# Patient Record
Sex: Male | Born: 1940 | Race: White | Hispanic: No | Marital: Married | Smoking: Former smoker
Health system: Southern US, Community
[De-identification: ages and names within clinical notes are randomized; demographics above are authoritative.]

## PROBLEM LIST (undated history)

## (undated) DIAGNOSIS — M1990 Unspecified osteoarthritis, unspecified site: Secondary | ICD-10-CM

## (undated) DIAGNOSIS — H269 Unspecified cataract: Secondary | ICD-10-CM

## (undated) DIAGNOSIS — I2510 Atherosclerotic heart disease of native coronary artery without angina pectoris: Secondary | ICD-10-CM

## (undated) DIAGNOSIS — I10 Essential (primary) hypertension: Secondary | ICD-10-CM

## (undated) DIAGNOSIS — I639 Cerebral infarction, unspecified: Secondary | ICD-10-CM

## (undated) DIAGNOSIS — I517 Cardiomegaly: Secondary | ICD-10-CM

## (undated) DIAGNOSIS — E785 Hyperlipidemia, unspecified: Secondary | ICD-10-CM

## (undated) DIAGNOSIS — R251 Tremor, unspecified: Secondary | ICD-10-CM

## (undated) DIAGNOSIS — I350 Nonrheumatic aortic (valve) stenosis: Secondary | ICD-10-CM

## (undated) DIAGNOSIS — K219 Gastro-esophageal reflux disease without esophagitis: Secondary | ICD-10-CM

## (undated) HISTORY — PX: NASAL SINUS SURGERY: SHX719

## (undated) HISTORY — DX: Essential (primary) hypertension: I10

## (undated) HISTORY — DX: Unspecified osteoarthritis, unspecified site: M19.90

## (undated) HISTORY — DX: Nonrheumatic aortic (valve) stenosis: I35.0

## (undated) HISTORY — DX: Atherosclerotic heart disease of native coronary artery without angina pectoris: I25.10

## (undated) HISTORY — DX: Cardiomegaly: I51.7

## (undated) HISTORY — DX: Hyperlipidemia, unspecified: E78.5

## (undated) HISTORY — PX: FRACTURE SURGERY: SHX138

## (undated) HISTORY — PX: COLONOSCOPY: SHX174

---

## 2003-07-14 HISTORY — PX: CORONARY ARTERY BYPASS GRAFT: SHX141

## 2004-05-31 ENCOUNTER — Inpatient Hospital Stay (HOSPITAL_COMMUNITY): Admission: AD | Admit: 2004-05-31 | Discharge: 2004-06-10 | Payer: Self-pay | Admitting: Cardiothoracic Surgery

## 2014-12-07 HISTORY — PX: TEE WITHOUT CARDIOVERSION: SHX5443

## 2015-01-17 HISTORY — PX: CARDIAC CATHETERIZATION: SHX172

## 2015-01-24 ENCOUNTER — Other Ambulatory Visit: Payer: Self-pay | Admitting: *Deleted

## 2015-01-24 DIAGNOSIS — I350 Nonrheumatic aortic (valve) stenosis: Secondary | ICD-10-CM

## 2015-01-29 ENCOUNTER — Encounter: Payer: Self-pay | Admitting: *Deleted

## 2015-01-29 ENCOUNTER — Ambulatory Visit (INDEPENDENT_AMBULATORY_CARE_PROVIDER_SITE_OTHER): Payer: Medicare Other | Admitting: Cardiovascular Disease

## 2015-01-29 ENCOUNTER — Institutional Professional Consult (permissible substitution) (INDEPENDENT_AMBULATORY_CARE_PROVIDER_SITE_OTHER): Payer: Medicare Other | Admitting: Cardiothoracic Surgery

## 2015-01-29 ENCOUNTER — Encounter: Payer: Self-pay | Admitting: Cardiothoracic Surgery

## 2015-01-29 ENCOUNTER — Encounter: Payer: Self-pay | Admitting: Cardiovascular Disease

## 2015-01-29 VITALS — BP 130/68 | HR 67 | Ht 69.0 in | Wt 146.0 lb

## 2015-01-29 VITALS — BP 127/71 | HR 69 | Resp 16 | Ht 69.0 in | Wt 145.0 lb

## 2015-01-29 DIAGNOSIS — I350 Nonrheumatic aortic (valve) stenosis: Secondary | ICD-10-CM

## 2015-01-29 DIAGNOSIS — I2510 Atherosclerotic heart disease of native coronary artery without angina pectoris: Secondary | ICD-10-CM | POA: Insufficient documentation

## 2015-01-29 DIAGNOSIS — I10 Essential (primary) hypertension: Secondary | ICD-10-CM | POA: Insufficient documentation

## 2015-01-29 DIAGNOSIS — M1990 Unspecified osteoarthritis, unspecified site: Secondary | ICD-10-CM | POA: Insufficient documentation

## 2015-01-29 DIAGNOSIS — I517 Cardiomegaly: Secondary | ICD-10-CM | POA: Insufficient documentation

## 2015-01-29 DIAGNOSIS — E785 Hyperlipidemia, unspecified: Secondary | ICD-10-CM | POA: Insufficient documentation

## 2015-01-29 NOTE — Progress Notes (Signed)
[address]       [place] [address]             [phone]                    Brandon Scott [facility] Medical Record #[id number] Date of Birth: 12/17/1940  Referring: Brandon Courts, RN Primary Care: Pcp Not In System  Chief Complaint:    Chief Complaint  Patient presents with  . Aortic Stenosis    SEVERE..EVAL FOR AVR VS TAVR.Marland KitchenECHO 12/07/14, CATH 01/17/15 ...both performed @ [facility] [place]    History of Present Illness:    Brandon Scott 74 y.o. male is seen in the office  today for aortic stenosis. He noted increasing shortness of breath and weakness on exertion after doing work around his house. He has been mowing the grass with a self-propelled mower but notes over the past 3 months that this is become increasingly difficult. He denies any definite syncopal episodes but does admit to presyncope especially when exerting himself. Recent evaluation in [facility] demonstrated severe aortic stenosis.    The patient has a history of CAD and previous CABG in 2005 done by me. At that time he had coronary artery bypass grafting 4 with the left internal mammary to left anterior descending coronary artery reverse saphenous vein graft to the first and second obtuse marginal and reverse saphenous 9 graft to the distal right coronary artery.  He underwent echo and cardiac studies in [place]. An echo from 12/07/2014 showed normal LV function with an LVEF 55-60%, with mild LVH and aortic stenosis with peak and mean gradients of 61 and 30 mmHg, respectively. The calculated AVA is 0.6 square cm. Cardiac catheterization 01/18/2015 demonstrating severe native vessel CAD with total occlusion of the mid-LAD, left circumflex origin, and proximal RCA. The patient's bypass grafts were patent and these include a SVG-right PDA, SVG sequential to OM1 and OM2, and LIMA-LAD. His LVEF was normal and estimated at 60% with normal LVEDP and  normal right heart pressures. Invasive hemodynamics demonstrate preserved CO 3.36 L/min, peak and mean transaortic valve gradients of 26 and 25 mmHg ( ? Recording of data), respectively, and a calculated aortic valve area of 0.81 square cm.  Patient also describes poor appetite and significant weight loss, at the time of his coronary bypass surgery 11 years ago he weighed 192 pounds he is now down to 145, he's had a 5-10 pound weight loss over the last 3-4 months.   Current Activity/ Functional Status:  Patient is independent with mobility/ambulation, transfers, ADL's, IADL's.   Zubrod Score: At the time of surgery this patient's most appropriate activity status/level should be described as: []     0    Normal activity, no symptoms [x]     1    Restricted in physical strenuous activity but ambulatory, able to do out light work []     2    Ambulatory and capable of self care, unable to do work activities, up and about               >50 % of waking hours                              []     3    Only limited self care, in bed greater than 50% of waking hours []   4    Completely disabled, no self care, confined to bed or chair []     5    Moribund   Past Medical History  Diagnosis Date  . CAD (coronary artery disease)   . Aortic stenosis   . Hyperlipidemia   . Hypertension   . Left ventricular hypertrophy   . Arthritis     Past Surgical History  Procedure Laterality Date  . Coronary artery bypass graft  2005  . Tee without cardioversion  12/07/14    [facility] [place]  . Cardiac catheterization  01/17/15    [facility] [place]  . Fracture surgery Left   . Nasal sinus surgery      No family history on file.  History   Social History  . Marital Status: Married    Spouse Name: N/A  . Number of Children: N/A  . Years of Education: N/A   Occupational History  . Not on file.   Social History Main Topics  . Smoking status: Former Smoker --  1.00 packs/day for 15 years    Types: Cigarettes    Quit date: 01/29/1975  . Smokeless tobacco: Never Used  . Alcohol Use: No  . Drug Use: Not on file  . Sexual Activity: Not on file   Other Topics Concern  . Not on file   Social History Narrative    History  Smoking status  . Former Smoker -- 1.00 packs/day for 15 years  . Types: Cigarettes  . Quit date: 01/29/1975  Smokeless tobacco  . Never Used    History  Alcohol Use No     Allergies  Allergen Reactions  . Sulfa Antibiotics Hives    Current Outpatient Prescriptions  Medication Sig Dispense Refill  . aspirin 81 MG tablet Take 81 mg by mouth daily.    . fluticasone (FLONASE) 50 MCG/ACT nasal spray Place 1 spray into both nostrils 2 (two) times daily.    . metoprolol tartrate (LOPRESSOR) 25 MG tablet Take 25 mg by mouth 2 (two) times daily.    . niacin 250 MG tablet Take 250 mg by mouth at bedtime.    . primidone (MYSOLINE) 50 MG tablet Take by mouth 2 (two) times daily.     No current facility-administered medications for this visit.      Review of Systems:     Cardiac Review of Systems: Y or N  Chest Pain [  n  ]  Resting SOB [ n  ] Exertional SOB  Cove.Etienne[y  ]  Orthopnea [ y ]   Pedal Edema [n   ]    Palpitations [ n ] Syncope  Milo.Brash[n  ]   Presyncope Cove.Etienne[y   ]  General Review of Systems: [Y] = yes [  ]=no Constitional: recent weight change Cove.Etienne[y  ];  Wt loss over the last 3 months [ 5  ] anorexia [  ]; fatigue [  ]; nausea [  ]; night sweats [  ]; fever [  ]; or chills [  ];          Dental: poor dentition[  ]; Last Dentist visit:   Eye : blurred vision [  ]; diplopia [   ]; vision changes [  ];  Amaurosis fugax[  ]; Resp: cough [n  ];  wheezing[  n];  hemoptysis[n  ]; shortness of breath[y  ]; paroxysmal nocturnal dyspnea[  y]; dyspnea on exertion[ y ]; or orthopnea[  ];  GI:  gallstones[  ], vomiting[  ];  dysphagia[  ]; melena[  ];  hematochezia [  ]; heartburn[  ];   Hx of  Colonoscopy[  ]; GU: kidney stones [  ];  hematuria[  ];   dysuria [  ];  nocturia[  ];  history of     obstruction [  ]; urinary frequency [  ]             Skin: rash, swelling[  ];, hair loss[  ];  peripheral edema[  ];  or itching[  ]; Musculosketetal: myalgias[  ];  joint swelling[  ];  joint erythema[  ];  joint pain[  ];  back pain[  ];  Heme/Lymph: bruising[  ];  bleeding[  ];  anemia[  ];  Neuro: TIA[  ];  headaches[  ];  stroke[  ];  vertigo[  n];  seizures[n  ];   paresthesias[n  ];  difficulty walking[  y];  Psych:depression[ n ]; anxiety[ y ];  Endocrine: diabetes[ n ];  thyroid dysfunction[  ];  Immunizations: Flu up to date [  y]; Pneumococcal up to date [ y ];  Other:  Physical Exam: BP 127/71 mmHg  Pulse 69  Resp 16  Ht  (1.753 m)  Wt 145 lb (65.772 kg)  BMI 21.40 kg/m2  SpO2 98%  PHYSICAL EXAMINATION: General appearance: alert, cooperative, appears older than stated age, distracted and slowed mentation Head: Normocephalic, without obvious abnormality, atraumatic Neck: no adenopathy, no carotid bruit, no JVD, supple, symmetrical, trachea midline and thyroid not enlarged, symmetric, no tenderness/mass/nodules Lymph nodes: Cervical, supraclavicular, and axillary nodes normal. Resp: clear to auscultation bilaterally Back: symmetric, no curvature. ROM normal. No CVA tenderness. Cardio: systolic murmur: holosystolic 3/6, crescendo throughout the precordium GI: soft, non-tender; bowel sounds normal; no masses,  no organomegaly Extremities: extremities normal, atraumatic, no cyanosis or edema and Homans sign is negative, no sign of DVT Neurologic: Grossly normal  Slow moving gate, no tremor ecce pt of lower lip, none in hands  Diagnostic Studies & Laboratory data:     Recent Radiology Findings:   No results found.   I have independently reviewed the above radiologic studies.  Recent Lab Findings: No results found for: WBC, HGB, HCT, PLT, GLUCOSE, CHOL, TRIG, HDL, LDLDIRECT, LDLCALC, ALT, AST, NA, K, CL,  CREATININE, BUN, CO2, TSH, INR, GLUF, HGBA1C  ECHO and Cath films loaded into PACS system and reviewed   Assessment / Plan:   Patient has severe underlying coronary artery disease of his native vessels with patent grafts. He now has critical aortic stenosis which is symptomatic. He would require redo surgery using standard aortic valve replacement. Because of his frail appearance on exam I would think that standard redo surgery would be high risk for him. I would recommend proceeding with evaluation for T AVER including scans to evaluate aortic root and access. I discussed this with the patient and his family who concur. His family notes that over the past 6 months he's had significant overall decline in his physical ability.      I  spent 55 minutes counseling the patient face to face and 50% or more the  time was spent in counseling and coordination of care. The total time spent in the appointment was 40 minutes.  Delight Ovens MD      [address] [place] [place] [address] Office [phone]   Beeper [phone]  01/29/2015 4:10 PM

## 2015-01-29 NOTE — Patient Instructions (Signed)
Medication Instructions:  Your physician recommends that you continue on your current medications as directed. Please refer to the Current Medication list given to you today.  Labwork: No new orders.   Testing/Procedures: No new orders.   Follow-Up: Please continue with further TAVR evaluation.   Any Other Special Instructions Will Be Listed Below (If Applicable).

## 2015-01-29 NOTE — Progress Notes (Addendum)
Cardiology Office Note Date:  01/29/2015   ID:  Brandon Scott, DOB 15-Nov-1940, MRN [id number]  PCP:  Pcp Not In System  Cardiologist:  Tonny Bollmanooper, Arlenne Kimbley, MD    Chief Complaint  Patient presents with  . Shortness of Breath   History of Present Illness: Brandon Scott is a 74 y.o. male who presents for evaluation of severe aortic stenosis.   The patient has a history of CAD and previous CABG in 2005 by Dr Tyrone SageGerhardt. He recently underwent echo and cardiac studies. An echo from 12/07/2014 showed normal LV function with an LVEF 55-60%, with mild LVH and aortic stenosis with peak and mean gradients of 61 and 30 mmHg, respectively. The calculated AVA is 0.6 square cm. Cardiac catheterization 01/18/2015 demonstrating severe native vessel CAD with total occlusion of the mid-LAD, left circumflex origin, and proximal RCA. The patient's bypass grafts were patent and these include a SVG-right PDA, SVG sequential to OM1 and OM2, and LIMA-LAD. His LVEF was normal and estimated at 60% with normal LVEDP and normal right heart pressures. Invasive hemodynamics demonstrate preserved CO 3.36 L/min, peak and mean transaortic valve gradients of 26 and 25 mmHg, respectively, and a calculated aortic valve area of 0.81 square cm.  Over the past 2-3 years, he has developed progressive fatigue and exercise intolerance. He complains of shortness of breath with activity. Reports occasional orthopnea, but no PND or leg swelling. No chest pain, chest pressure, lightheadedness, or syncope.   He has developed an essential tremor and this has progressed over recent months. Has some problems with secretions/salivation as well. He has been evaluated by neurology and has treatment recently with mysoline. He has not been diagnosed with Parkinson's. Apparently his mother had a similar tremor as she aged.   The patient has lost significant weight over recent years. Since CABG he has lost 70 pounds slowly. Appetite is reported as ok, and  he eats small portions.     Past Medical History  Diagnosis Date  . CAD (coronary artery disease)   . Aortic stenosis   . Hyperlipidemia   . Hypertension   . Left ventricular hypertrophy   . Arthritis     Past Surgical History  Procedure Laterality Date  . Coronary artery bypass graft  2005  . Tee without cardioversion  12/07/14    [facility] [place]  . Cardiac catheterization  01/17/15    [facility] [place]  . Fracture surgery Left   . Nasal sinus surgery      Current Outpatient Prescriptions  Medication Sig Dispense Refill  . aspirin 81 MG tablet Take 81 mg by mouth daily.    . fluticasone (FLONASE) 50 MCG/ACT nasal spray Place 1 spray into both nostrils 2 (two) times daily.    . metoprolol (LOPRESSOR) 50 MG tablet Take 25 mg by mouth 2 (two) times daily.   0  . niacin 250 MG tablet Take 250 mg by mouth at bedtime.    Marland Kitchen. omeprazole (PRILOSEC) 40 MG capsule TK 1 C PO D  0  . primidone (MYSOLINE) 50 MG tablet Take by mouth 2 (two) times daily.     No current facility-administered medications for this visit.    Allergies:   Sulfa antibiotics   Social History:  The patient  reports that he quit smoking about 40 years ago. His smoking use included Cigarettes. He has a 15 pack-year smoking history. He has never used smokeless tobacco. He reports that he does  not drink alcohol.   Family History:  The patient's  family history includes Heart attack in his father and mother. There is no history of Hypertension or Stroke.    ROS:  Please see the history of present illness.  Otherwise, review of systems is positive for unexplained weight loss, decreased appetite, hearing loss, sinus congestion, tremor, leg pain.  All other systems are reviewed and negative.    PHYSICAL EXAM: VS:  BP 130/68 mmHg  Pulse 67  Ht  (1.753 m)  Wt 146 lb (66.225 kg)  BMI 21.55 kg/m2 , BMI Body mass index is 21.55 kg/(m^2). GEN: Well nourished, well  developed, somewhat delayed/deliberate speech, in no acute distress HEENT: normal Neck: no JVD, no masses. Delayed upstrokes with bilateral bruits CV: RRR with late peaking 3/6 systolic murmur at RUSB, A2 is present                Respiratory:  Coarse breath sounds bilaterally, normal work of breathing GI: soft, nontender, nondistended, + BS MS: no deformity or atrophy Ext: no pretibial edema, pedal pulses 2+= bilaterally Skin: warm and dry, no rash Neuro:  Strength and sensation are intact Psych: euthymic mood, full affect  EKG:  EKG is ordered today. The ekg ordered today shows NSR 67 bpm, age-indeterminate septal infarct, nonspecific ST/T abnormality  Recent Labs: No results found for requested labs within last 365 days.   Lipid Panel  No results found for: CHOL, TRIG, HDL, CHOLHDL, VLDL, LDLCALC, LDLDIRECT    Wt Readings from Last 3 Encounters:  01/29/15 146 lb (66.225 kg)  01/29/15 145 lb (65.772 kg)     Cardiac Studies Reviewed: Echo and cath films/reports reviewed  STS Risk Calculator: STS PROM = 2.5%  ASSESSMENT AND PLAN: 74 yo male with Stage D, severe aortic stenosis. He has a hx of severe CAD and previous CABG with patent bypass grafts by recent cath. He has NYHA III symptoms of dyspnea and fatigue. I have personally reviewed his echo and cath images and agree that he has severe aortic stenosis with severe calcification and restriction of his aortic leaflets, a calculated AVA between 0.6 and 0.8 square cm, and a dimensionless index of 0.17. I have reviewed the natural history of aortic stenosis with the patient and his family members who are present today. We have discussed the limitations of medical therapy and the poor prognosis associated with symptomatic aortic stenosis. We have also reviewed potential treatment options, including palliative medical therapy, conventional surgical aortic valve replacement, and transcatheter aortic valve replacement. We discussed  treatment options in the context of this patient's specific comorbid medical conditions. The patient has been seen by Dr Tyrone Sage and well as a cardiac surgeon at his home of [place], [place], both of whom recommended TAVR for treatment of severe aortic stenosis. I agree that TAVR would likely be favorable to conventional surgery considering his patent bypass grafts, progressive functional decline over recent years, and substantial weight loss and associated physical frailty.   I have reviewed plans for CTA studies of the heart, chest/abd/pelvis and rationale for both as they pertain to TAVR surgical planning. The patient will see Dr Laneta Simmers tomorrow after these studies are completed.   TAVR surgery was reviewed in detail with the patient, his wife, and his son who is an interventional radiologist.  The patient has been advised of a variety of complications that might develop including but not limited to risks of death, stroke, paravalvular leak, aortic dissection or other major vascular complications,  aortic annulus rupture, device embolization, cardiac rupture or perforation, mitral regurgitation, acute myocardial infarction, arrhythmia, heart block or bradycardia requiring permanent pacemaker placement, congestive heart failure, respiratory failure, renal failure, pneumonia, infection, other late complications related to structural valve deterioration or migration, or other complications that might ultimately cause a temporary or permanent loss of functional independence or other long term morbidity.  The patient agrees to proceed with TAVR pending the results of upcoming testing to confirm his suitability for the procedure.   Current medicines are reviewed with the patient today.  The patient does not have concerns regarding medicines.  Labs/ tests ordered today include:  No orders of the defined types were placed in this encounter.    Enzo Bi, MD  01/29/2015 4:30 PM    [facility] [address] [place], [place], [place]  [address] Phone: [phone]; Fax: [phone]

## 2015-01-30 ENCOUNTER — Ambulatory Visit (HOSPITAL_COMMUNITY): Payer: Medicare Other

## 2015-01-30 ENCOUNTER — Ambulatory Visit: Payer: Medicare Other | Attending: Surgery | Admitting: Physical Therapy

## 2015-01-30 ENCOUNTER — Ambulatory Visit (HOSPITAL_COMMUNITY)
Admission: RE | Admit: 2015-01-30 | Discharge: 2015-01-30 | Disposition: A | Discharge status: Home / Self Care | Payer: Medicare Other | Source: Ambulatory Visit | Attending: Cardiovascular Disease | Admitting: Cardiovascular Disease

## 2015-01-30 ENCOUNTER — Institutional Professional Consult (permissible substitution) (INDEPENDENT_AMBULATORY_CARE_PROVIDER_SITE_OTHER): Payer: Medicare Other | Admitting: Surgery

## 2015-01-30 ENCOUNTER — Encounter: Payer: Self-pay | Admitting: Physical Therapy

## 2015-01-30 ENCOUNTER — Other Ambulatory Visit: Payer: Self-pay | Admitting: *Deleted

## 2015-01-30 VITALS — BP 100/66 | HR 92 | Resp 16 | Ht 69.0 in | Wt 145.0 lb

## 2015-01-30 DIAGNOSIS — I350 Nonrheumatic aortic (valve) stenosis: Secondary | ICD-10-CM | POA: Insufficient documentation

## 2015-01-30 DIAGNOSIS — Z01818 Encounter for other preprocedural examination: Secondary | ICD-10-CM | POA: Diagnosis not present

## 2015-01-30 DIAGNOSIS — Z951 Presence of aortocoronary bypass graft: Secondary | ICD-10-CM | POA: Diagnosis not present

## 2015-01-30 DIAGNOSIS — R262 Difficulty in walking, not elsewhere classified: Secondary | ICD-10-CM

## 2015-01-30 MED ORDER — IOHEXOL 350 MG/ML SOLN
75.0000 mL | Freq: Once | INTRAVENOUS | Status: AC | PRN
  Administered 2015-01-30: 100 mL via INTRAVENOUS

## 2015-01-30 MED ORDER — IOHEXOL 350 MG/ML SOLN
80.0000 mL | Freq: Once | INTRAVENOUS | Status: AC | PRN
  Administered 2015-01-30: 50 mL via INTRAVENOUS

## 2015-01-30 NOTE — Therapy (Signed)
[facility] [address] [place], [place], [address] Phone: [phone]   Fax:  [phone]  Physical Therapy Evaluation  Patient Details  Name: Brandon Scott MRN: [id number] Date of Birth: 12-Sep-1940 Referring Provider:  Alleen Borne, MD  Encounter Date: 01/30/2015      PT End of Session - 01/30/15 0927    Visit Number 1   PT Start Time 0840   PT Stop Time 0920   PT Time Calculation (min) 40 min      Past Medical History  Diagnosis Date  . CAD (coronary artery disease)   . Aortic stenosis   . Hyperlipidemia   . Hypertension   . Left ventricular hypertrophy   . Arthritis     Past Surgical History  Procedure Laterality Date  . Coronary artery bypass graft  2005  . Tee without cardioversion  12/07/14    [facility] [place]  . Cardiac catheterization  01/17/15    [facility] [place]  . Fracture surgery Left   . Nasal sinus surgery      There were no vitals filed for this visit.  Visit Diagnosis:  Difficulty walking - Plan: PT plan of care cert/re-cert  Severe aortic stenosis - Plan: PT plan of care cert/re-cert      Subjective Assessment - 01/30/15 0843    Subjective SOB, fatigue, low energy began about 6 months ago and gradually worsening; limiting golf, yardwork   Patient Stated Goals return to golf, yardwork, managing tomato garden   Currently in Pain? No/denies            [facility] PT Assessment - 01/30/15 0001    Assessment   Medical Diagnosis severe aortic stenosis   Onset Date/Surgical Date 08/01/14  approximate   Precautions   Precaution Comments no strenusous activity   Restrictions   Weight Bearing Restrictions No   Balance Screen   Has the patient fallen in the past 6 months No   Has the patient had a decrease in activity level because of a fear of falling?  No   Is the patient reluctant to leave their home because of a fear of falling?  No   Home  [employer] information [employer] manager residence   Living Arrangements Spouse/significant other   Home Access Stairs to enter   Entrance Stairs-Number of Steps 3   Entrance Stairs-Rails Right;Left;Can reach both   Home Layout One level   Prior Function   Level of Independence Independent  yardwork, golf, gardening   Vocation Retired  Visual merchandiser   Posture/Postural Control   Posture/Postural Control Postural limitations   Postural Limitations Forward head;Rounded Shoulders   ROM / Strength   AROM / PROM / Strength AROM;Strength   AROM   Overall AROM  Within functional limits for tasks performed   Strength   Overall Strength Comments grossly 4-5/5 throughout   Strength Assessment Site Hand   Right/Left hand Right;Left   Right Hand Grip (lbs) 70  R hand dominant   Left Hand Grip (lbs) 69   Ambulation/Gait   Gait Comments Pt demonstrates good step/stride length and no significant gait deviations.          [facility] Pre-Surgical Assessment - 01/30/15 0001    5 Meter Walk Test- trial 1 5 sec   5 Meter Walk Test- trial 2 4 sec.    5 Meter Walk Test- trial 3 4 sec.  </= 6 sec WNL  5 meter walk test average 4.33 sec   Timed Up & Go Test trial  10 sec.   Comments </= 12 sec not indicative of high fall risk   4 Stage Balance Test tolerated for:  10 sec.   4 Stage Balance Test Position 4   comment not indicative of high fall risk   Sit To Stand Test- trial 1 16 sec.   Comment </= 12.6 WNL   ADL/IADL Independent with: Bathing;Dressing;Meal prep;Finances   ADL/IADL Needs Assistance with: Pincus Badder work  requires rest breaks and assist with part of it   6 Minute Walk- Baseline yes   BP (mmHg) 130/78 mmHg   HR (bpm) 89   02 Sat (%RA) 96 %   Modified Borg Scale for Dyspnea 0- Nothing at all   Perceived Rate of Exertion (Borg) 6-   6 Minute Walk Post Test yes   BP (mmHg) 138/78 mmHg   HR (bpm) 147   02 Sat (%RA) 97 %   Modified Borg Scale for Dyspnea 1- Very mild  shortness of breath   Perceived Rate of Exertion (Borg) 15- Hard   Aerobic Endurance Distance Walked 815   Endurance additional comments PT asked patient to rest at 3:56 as HR was at maximal range and pt reported if he were at home and felt this tired, he would rest. It ook 3-4 minutes for HR to recover to baseline and pt did not feel recovered enough to resume within the 6 minute timeframe.                          PT Education - 2015-02-06 [phone]    Education provided Yes   Education Details importance of frequent rests with extended activity as HR reaches max range   Person(s) Educated Patient;Spouse;Child(ren)   Methods Explanation   Comprehension Verbalized understanding                    Plan - 06-Feb-2015 0928    Clinical Impression Statement Pt is a 74 yo male presenting to OP PT for evaluation prior to possible TAVR surgery due to severe aortic stenosis. Pt reports onset of SOB, slowed energy/fatigue that began approximately 6 months ago and has gradually worsened. It is now limiting him with his gardening, yardwork, and golf. Pt presents with good ROM/strength, balance, and fair posture. Pt's gait speed and quality are good however he is significantly limited with his endurance due to HR increasing to max range quickly with sustained ambulation. Pt ambulated 815' in 6 minute walk test and had to stop at just under 4 minutes due to HR of 147. It took about 3-4 minutes of seated rest to recover.    PT Frequency One time visit          G-Codes - February 06, 2015 0932    Functional Assessment Tool Used 6 minute walk 815' limited at 4 minutes due to max HR   Functional Limitation Mobility: Walking and moving around   Mobility: Walking and Moving Around Current Status [phone]) At least 20 percent but less than 40 percent impaired, limited or restricted   Mobility: Walking and Moving Around Goal Status [phone]) At least 20 percent but less than 40 percent impaired, limited  or restricted   Mobility: Walking and Moving Around Discharge Status [phone]) At least 20 percent but less than 40 percent impaired, limited or restricted       Problem List Patient Active Problem List  Diagnosis Date Noted  . CAD (coronary artery disease)   . Aortic stenosis   . Hyperlipidemia   . Hypertension   . Left ventricular hypertrophy   . Arthritis     Sherin Murdoch, PT 01/30/2015, 9:33 AM  [facility] [address] [place], [place], [id number] Phone: [phone]   Fax:  [phone]

## 2015-01-31 ENCOUNTER — Other Ambulatory Visit: Payer: Self-pay

## 2015-01-31 ENCOUNTER — Other Ambulatory Visit (HOSPITAL_COMMUNITY): Payer: Self-pay | Admitting: Respiratory Therapy

## 2015-01-31 DIAGNOSIS — I350 Nonrheumatic aortic (valve) stenosis: Secondary | ICD-10-CM

## 2015-02-01 ENCOUNTER — Ambulatory Visit (HOSPITAL_COMMUNITY)
Admission: RE | Admit: 2015-02-01 | Discharge: 2015-02-01 | Disposition: A | Discharge status: Home / Self Care | Payer: Medicare Other | Source: Ambulatory Visit | Attending: Surgery | Admitting: Surgery

## 2015-02-01 ENCOUNTER — Encounter (HOSPITAL_COMMUNITY): Payer: Self-pay

## 2015-02-01 ENCOUNTER — Other Ambulatory Visit (HOSPITAL_COMMUNITY): Payer: Self-pay | Admitting: *Deleted

## 2015-02-01 ENCOUNTER — Encounter (HOSPITAL_COMMUNITY)
Admission: RE | Admit: 2015-02-01 | Discharge: 2015-02-01 | Disposition: A | Discharge status: Home / Self Care | Payer: Medicare Other | Source: Ambulatory Visit | Attending: Surgery | Admitting: Surgery

## 2015-02-01 VITALS — BP 116/53 | HR 60 | Temp 98.2°F | Resp 18 | Ht 69.0 in | Wt 147.0 lb

## 2015-02-01 DIAGNOSIS — Z01812 Encounter for preprocedural laboratory examination: Secondary | ICD-10-CM | POA: Insufficient documentation

## 2015-02-01 DIAGNOSIS — Z87891 Personal history of nicotine dependence: Secondary | ICD-10-CM | POA: Insufficient documentation

## 2015-02-01 DIAGNOSIS — Z01818 Encounter for other preprocedural examination: Secondary | ICD-10-CM | POA: Insufficient documentation

## 2015-02-01 DIAGNOSIS — I6523 Occlusion and stenosis of bilateral carotid arteries: Secondary | ICD-10-CM | POA: Insufficient documentation

## 2015-02-01 DIAGNOSIS — I350 Nonrheumatic aortic (valve) stenosis: Secondary | ICD-10-CM

## 2015-02-01 DIAGNOSIS — Z79899 Other long term (current) drug therapy: Secondary | ICD-10-CM | POA: Diagnosis not present

## 2015-02-01 DIAGNOSIS — I2510 Atherosclerotic heart disease of native coronary artery without angina pectoris: Secondary | ICD-10-CM | POA: Diagnosis not present

## 2015-02-01 DIAGNOSIS — Z7982 Long term (current) use of aspirin: Secondary | ICD-10-CM | POA: Insufficient documentation

## 2015-02-01 DIAGNOSIS — M5134 Other intervertebral disc degeneration, thoracic region: Secondary | ICD-10-CM | POA: Insufficient documentation

## 2015-02-01 DIAGNOSIS — Z0183 Encounter for blood typing: Secondary | ICD-10-CM | POA: Diagnosis not present

## 2015-02-01 HISTORY — DX: Unspecified cataract: H26.9

## 2015-02-01 HISTORY — DX: Cerebral infarction, unspecified: I63.9

## 2015-02-01 HISTORY — DX: Tremor, unspecified: R25.1

## 2015-02-01 HISTORY — DX: Gastro-esophageal reflux disease without esophagitis: K21.9

## 2015-02-01 LAB — PULMONARY FUNCTION TEST
DL/VA % pred: 99 %
DL/VA: 4.3 ml/min/mmHg/L
DLCO UNC: 20.85 ml/min/mmHg
DLCO unc % pred: 77 %
FEF 25-75 Post: 2.42 L/sec
FEF 25-75 Pre: 2.88 L/sec
FEF2575-%CHANGE-POST: -16 %
FEF2575-%PRED-PRE: 158 %
FEF2575-%Pred-Post: 132 %
FEV1-%CHANGE-POST: -5 %
FEV1-%Pred-Post: 98 %
FEV1-%Pred-Pre: 104 %
FEV1-Post: 2.51 L
FEV1-Pre: 2.65 L
FEV1FVC-%CHANGE-POST: 2 %
FEV1FVC-%Pred-Pre: 114 %
FEV6-%Change-Post: -7 %
FEV6-%PRED-POST: 88 %
FEV6-%PRED-PRE: 95 %
FEV6-Post: 2.91 L
FEV6-Pre: 3.14 L
FEV6FVC-%Change-Post: 1 %
FEV6FVC-%PRED-POST: 107 %
FEV6FVC-%PRED-PRE: 105 %
FVC-%CHANGE-POST: -7 %
FVC-%Pred-Post: 83 %
FVC-%Pred-Pre: 90 %
FVC-PRE: 3.2 L
FVC-Post: 2.97 L
POST FEV1/FVC RATIO: 85 %
Post FEV6/FVC ratio: 100 %
Pre FEV1/FVC ratio: 83 %
Pre FEV6/FVC Ratio: 98 %
RV % pred: 137 %
RV: 3.23 L
TLC % pred: 99 %
TLC: 6.21 L

## 2015-02-01 LAB — APTT: aPTT: 26 seconds (ref 24–37)

## 2015-02-01 LAB — CBC
HCT: 41.3 % (ref 39.0–52.0)
HEMOGLOBIN: 13.4 g/dL (ref 13.0–17.0)
MCH: 30 pg (ref 26.0–34.0)
MCHC: 32.4 g/dL (ref 30.0–36.0)
MCV: 92.6 fL (ref 78.0–100.0)
Platelets: 187 10*3/uL (ref 150–400)
RBC: 4.46 MIL/uL (ref 4.22–5.81)
RDW: 13.2 % (ref 11.5–15.5)
WBC: 6.4 10*3/uL (ref 4.0–10.5)

## 2015-02-01 LAB — BLOOD GAS, ARTERIAL
Acid-Base Excess: 0.5 mmol/L (ref 0.0–2.0)
Bicarbonate: 24.4 mEq/L — ABNORMAL HIGH (ref 20.0–24.0)
Drawn by: 42180
FIO2: 0.21 %
O2 SAT: 98.2 %
PCO2 ART: 38.3 mmHg (ref 35.0–45.0)
Patient temperature: 98.6
TCO2: 25.6 mmol/L (ref 0–100)
pH, Arterial: 7.421 (ref 7.350–7.450)
pO2, Arterial: 101 mmHg — ABNORMAL HIGH (ref 80.0–100.0)

## 2015-02-01 LAB — COMPREHENSIVE METABOLIC PANEL
ALT: 12 U/L — AB (ref 17–63)
AST: 18 U/L (ref 15–41)
Albumin: 3.6 g/dL (ref 3.5–5.0)
Alkaline Phosphatase: 38 U/L (ref 38–126)
Anion gap: 7 (ref 5–15)
BILIRUBIN TOTAL: 0.3 mg/dL (ref 0.3–1.2)
BUN: 17 mg/dL (ref 6–20)
CHLORIDE: 108 mmol/L (ref 101–111)
CO2: 22 mmol/L (ref 22–32)
Calcium: 8.9 mg/dL (ref 8.9–10.3)
Creatinine, Ser: 0.86 mg/dL (ref 0.61–1.24)
GFR calc Af Amer: >60 mL/min (ref >60)
GFR calc non Af Amer: >60 mL/min (ref >60)
Glucose, Bld: 101 mg/dL — ABNORMAL HIGH (ref 65–99)
Potassium: 4.5 mmol/L (ref 3.5–5.1)
SODIUM: 137 mmol/L (ref 135–145)
Total Protein: 6.4 g/dL — ABNORMAL LOW (ref 6.5–8.1)

## 2015-02-01 LAB — SURGICAL PCR SCREEN
MRSA, PCR: NEGATIVE
STAPHYLOCOCCUS AUREUS: NEGATIVE

## 2015-02-01 LAB — URINALYSIS, ROUTINE W REFLEX MICROSCOPIC
Bilirubin Urine: NEGATIVE
Glucose, UA: NEGATIVE mg/dL
HGB URINE DIPSTICK: NEGATIVE
KETONES UR: NEGATIVE mg/dL
Leukocytes, UA: NEGATIVE
NITRITE: NEGATIVE
PROTEIN: NEGATIVE mg/dL
SPECIFIC GRAVITY, URINE: 1.023 (ref 1.005–1.030)
Urobilinogen, UA: 1 mg/dL (ref 0.0–1.0)
pH: 5.5 (ref 5.0–8.0)

## 2015-02-01 LAB — PROTIME-INR
INR: 1.01 (ref 0.00–1.49)
Prothrombin Time: 13.5 seconds (ref 11.6–15.2)

## 2015-02-01 LAB — ABO/RH: ABO/RH(D): A POS

## 2015-02-01 MED ORDER — ALBUTEROL SULFATE (2.5 MG/3ML) 0.083% IN NEBU
2.5000 mg | INHALATION_SOLUTION | Freq: Once | RESPIRATORY_TRACT | Status: AC
  Administered 2015-02-01: 2.5 mg via RESPIRATORY_TRACT

## 2015-02-01 NOTE — Pre-Procedure Instructions (Signed)
SOPHIE QUILES  02/01/2015       Your procedure is scheduled on Tuesday, February 05, 2015 at 11:05 AM.   Report to [facility] Entrance "A" Admitting Office at 8:00 AM.   Call this number if you have problems the morning of surgery: [phone]   Any questions prior to day of surgery, please call [phone] between 8 & 4 PM.   Remember:  Do not eat food or drink liquids after midnight Monday, 02/04/15.  Take these medicines the morning of surgery with A SIP OF WATER: Metoprolol (Lopressor), Omeprazole (Prilosec), Primidone (Mysoline)  Stop Advil as of today.    Do not wear jewelry.  Do not wear lotions, powders, or cologne.  You may wear deodorant.  Do not shave 48 hours prior to surgery.  Men may shave face and neck.  Do not bring valuables to the hospital.  [facility] is not responsible for any belongings or valuables.  Contacts, dentures or bridgework may not be worn into surgery.  Leave your suitcase in the car.  After surgery it may be brought to your room.  For patients admitted to the hospital, discharge time will be determined by your treatment team.  Special instructions:  [place] - Preparing for Surgery  Before surgery, you can play an important role.  Because skin is not sterile, your skin needs to be as free of germs as possible.  You can reduce the number of germs on you skin by washing with CHG (chlorahexidine gluconate) soap before surgery.  CHG is an antiseptic cleaner which kills germs and bonds with the skin to continue killing germs even after washing.  Please DO NOT use if you have an allergy to CHG or antibacterial soaps.  If your skin becomes reddened/irritated stop using the CHG and inform your nurse when you arrive at Short Stay.  Do not shave (including legs and underarms) for at least 48 hours prior to the first CHG shower.  You may shave your face.  Please follow these instructions carefully:   1.  Shower with CHG Soap the night before  surgery and the                                morning of Surgery.  2.  If you choose to wash your hair, wash your hair first as usual with your       normal shampoo.  3.  After you shampoo, rinse your hair and body thoroughly to remove the                      Shampoo.  4.  Use CHG as you would any other liquid soap.  You can apply chg directly       to the skin and wash gently with scrungie or a clean washcloth.  5.  Apply the CHG Soap to your body ONLY FROM THE NECK DOWN.        Do not use on open wounds or open sores.  Avoid contact with your eyes, ears, mouth and genitals (private parts).  Wash genitals (private parts) with your normal soap.  6.  Wash thoroughly, paying special attention to the area where your surgery        will be performed.  7.  Thoroughly rinse your body with warm water from the neck down.  8.  DO NOT shower/wash with your normal soap  after using and rinsing off       the CHG Soap.  9.  Pat yourself dry with a clean towel.            10.  Wear clean pajamas.            11.  Place clean sheets on your bed the night of your first shower and do not        sleep with pets.  Day of Surgery  Do not apply any lotions the morning of surgery.  Please wear clean clothes to the hospital.    Please read over the following fact sheets that you were given. Pain Booklet, Coughing and Deep Breathing, Blood Transfusion Information, MRSA Information and Surgical Site Infection Prevention

## 2015-02-01 NOTE — Progress Notes (Signed)
   02/01/15 0933  OBSTRUCTIVE SLEEP APNEA  Have you ever been diagnosed with sleep apnea through a sleep study? No  Do you snore loudly (loud enough to be heard through closed doors)?  1  Do you often feel tired, fatigued, or sleepy during the daytime? 1  Has anyone observed you stop breathing during your sleep? 0  Do you have, or are you being treated for high blood pressure? 1  BMI more than 35 kg/m2? 0  Age over 76 years old? 1  Neck circumference greater than 40 cm/16 inches? 0  Gender: 1

## 2015-02-01 NOTE — Progress Notes (Signed)
Pre-op Cardiac Surgery  Carotid Findings:  Bilateral:  1-39% ICA stenosis. 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 Vertebral artery flow is antegrade.      Upper Extremity Right Left  Brachial Pressures 136 129  Radial Waveforms Tri Tri  Ulnar Waveforms Tri Tri  Palmar Arch (Allen's Test) Decreases >50% with radial compression, normal with ulnar compression Decreases >50% with radial compression, obliterates with ulnar compression     Farrel Demark, RDMS, RVT 02/01/2015

## 2015-02-02 LAB — HEMOGLOBIN A1C
Hgb A1c MFr Bld: 6.2 % — ABNORMAL HIGH (ref 4.8–5.6)
Mean Plasma Glucose: 131 mg/dL

## 2015-02-03 ENCOUNTER — Encounter: Payer: Self-pay | Admitting: Surgery

## 2015-02-03 NOTE — Progress Notes (Signed)
[facility]  MULTIDISCIPLINARY HEART VALVE CLINIC    CARDIOTHORACIC SURGERY CONSULTATION REPORT  Referring Provider is [name], MD PCP is Pcp Not In System  Chief Complaint  Patient presents with  . Aortic Stenosis    2ND TAVR EVAL    HPI:  The patient is a 74 year old gentleman with a history of hypertension, hyperlipidemia and coronary disease s/p CABG x 4 by Dr. Tyrone Sage in 2005. He had a left internal mammary to left anterior descending coronary artery reverse saphenous vein graft to the first and second obtuse marginal and reverse saphenous vein graft to the distal right coronary artery. He lives in [place], [place] and has been followed by cardiology there for aortic stenosis. An echo from 12/07/2014 showed normal LV function with an LVEF 55-60%, with mild LVH and aortic stenosis with peak and mean gradients of 61 and 30 mmHg, respectively. The calculated AVA is 0.6 square cm. Cardiac catheterization 01/18/2015 demonstrated severe native vessel CAD with total occlusion of the mid-LAD, left circumflex origin, and proximal RCA. The patient's bypass grafts were patent and these include a SVG-right PDA, SVG sequential to OM1 and OM2, and LIMA-LAD. His LVEF was normal and estimated at 60% with normal LVEDP and normal right heart pressures. Invasive hemodynamics demonstrate preserved CO 3.36 L/min, peak and mean transaortic valve gradients of 26 and 25 mmHg, respectively, and a calculated aortic valve area of 0.81 square cm.  He is here with his son Beryle Beams who is an interventional radiologist with [facility] and his wife. Over the past 2-3 years, he has developed progressive exertional fatigue.  He complains of shortness of breath with activity and occasional orthopnea, but no PND or leg swelling. No chest pain, chest pressure, lightheadedness, or syncope. He has developed an essential tremor and this has progressed over recent months. Has some problems with  secretions/salivation as well. He has been evaluated by neurology and has treatment recently with mysoline. He has not been diagnosed with Parkinson's. Apparently his mother had a similar tremor as she aged.The patient has lost  70 pounds slowly since his CABG surgery.   Past Medical History  Diagnosis Date  . CAD (coronary artery disease)   . Aortic stenosis   . Hyperlipidemia   . Hypertension   . Left ventricular hypertrophy   . Arthritis   . Stroke     mini stroke 24 years ago  . GERD (gastroesophageal reflux disease)   . 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 Cataracts, bilateral     Past Surgical History  Procedure Laterality Date  . Tee without cardioversion  12/07/14    [facility] [place] [place]  . Cardiac catheterization  01/17/15    [facility] [place]  . Nasal sinus surgery    . Coronary artery bypass graft  2005    4 grafts  . Fracture surgery Left     leg  . Colonoscopy      Family History  Problem Relation Age of Onset  . Heart attack Mother   . Heart attack Father   . Hypertension Neg Hx   . Stroke Neg Hx     History   Social History  . Marital Status: Married    Spouse Name: N/A  . Number of Children: N/A  . Years of Education: N/A   Occupational History  . Not on file.   Social History Main Topics  .  Smoking status: Former Smoker -- 1.00 packs/day for 15 years    Types: Cigarettes    Quit date: 01/29/1975  . Smokeless tobacco: Never Used  . Alcohol Use: 0.0 oz/week    0 Standard drinks or equivalent per week     Comment: occasional  . Drug Use: No  . Sexual Activity: Not on file   Other Topics Concern  . Not on file   Social History Narrative    Current Outpatient Prescriptions  Medication Sig Dispense Refill  . aspirin 81 MG tablet Take 81 mg by mouth daily.    . fluticasone (FLONASE) 50 MCG/ACT nasal spray Place 1 spray into both nostrils 2 (two) times daily.    . metoprolol  (LOPRESSOR) 50 MG tablet Take 25 mg by mouth 2 (two) times daily.   0  . omeprazole (PRILOSEC) 40 MG capsule Take 1 capsule daily  0  . primidone (MYSOLINE) 50 MG tablet Take 50 mg by mouth 2 (two) times daily.     Marland Kitchen ibuprofen (ADVIL,MOTRIN) 200 MG tablet Take 400 mg by mouth every 8 (eight) hours as needed for mild pain or moderate pain.    . niacin 500 MG tablet Take 1,000 mg by mouth 2 (two) times daily with a meal.     No current facility-administered medications for this visit.    Allergies  Allergen Reactions  . Sulfa Antibiotics Hives      Review of Systems:   General:  decreased appetite, decreased energy, no weight gain, 70 lb weight loss since 2005, no fever  Cardiac:  no chest pain with exertion, no chest pain at rest, moderate SOB with mild exertion, no resting SOB, no PND, no orthopnea, no palpitations, no arrhythmia, no atrial fibrillation, no LE edema, no dizzy spells, no syncope  Respiratory:  has shortness of breath, no home oxygen, no productive cough, no dry cough, no bronchitis, no wheezing, no hemoptysis, no asthma, no pain with inspiration or cough, no sleep apnea, no CPAP at night  GI:   some difficulty swallowing, no reflux, no frequent heartburn, no hiatal hernia, no abdominal pain, no constipation, no diarrhea, no hematochezia, no hematemesis, no melena  GU:   no dysuria,  no frequency, no urinary tract infection, no hematuria, no enlarged prostate, no kidney stones, no kidney disease  Vascular:  no pain suggestive of claudication, no pain in feet, no leg cramps, no varicose veins, no DVT, no non-healing foot ulcer  Neuro:   remote stroke or TIA, no seizures, no headaches, notemporary blindness one eye,  no slurred speech, no peripheral neuropathy, no chronic pain, no instability of gait, no memory/cognitive dysfunction  Musculoskeletal: no arthritis, no joint swelling, no myalgias, no difficulty walking, normal mobility   Skin:   no rash, no itching, no skin  infections, no pressure sores or ulcerations  Psych:   no anxiety, no depression, no nervousness, no unusual recent stress  Eyes:   no blurry vision, no floaters, no recent vision changes,  wears glasses or contacts  ENT:   no hearing loss, no loose or painful teeth  Hematologic:  no easy bruising, no abnormal bleeding, no clotting disorder, no frequent epistaxis  Endocrine:  no diabetes, does not check CBG's at home           Physical Exam:   BP 100/66 mmHg  Pulse 92  Resp 16  Ht 5\' 9"  (1.753 m)  Wt 145 lb (65.772 kg)  BMI 21.40 kg/m2  SpO2 96%  General:  Elderly,   well-appearing gentleman with flat affect and no facial expression  HEENT:  Unremarkable , NCAT, PERLA, EOMI, oropharynx clear  Neck:   no JVD, no bruits, no adenopathy or thyromegaly  Chest:   clear to auscultation, symmetrical breath sounds, no wheezes, no rhonchi   CV:   RRR, grade III/VI crescendo/decrescendo murmur heard best at RSB,  no diastolic murmur  Abdomen:  soft, non-tender, no masses or organomegaly  Extremities:  warm, well-perfused, pulses palpable , no LE edema  Rectal/GU  Deferred  Neuro:   Grossly non-focal and symmetrical throughout  Skin:   Clean and dry, no rashes, no breakdown   Diagnostic Tests:  Echo and Cardiac cath were done in [place], [place]. I have personally reviewed the studies.  ADDENDUM REPORT: 01/30/2015 16:03  CLINICAL DATA: Aortic stenosis  EXAM: Cardiac TAVR CT  TECHNIQUE: The patient was scanned on a Philips 256 scanner. A 120 kV retrospective scan was triggered in the descending thoracic aorta at 111 HU's. Gantry rotation speed was 270 msecs and collimation was .9 mm. No beta blockade or nitro were given. The 3D data set was reconstructed in 5% intervals of the R-R cycle. Systolic and diastolic phases were analyzed on a dedicated work station using MPR, MIP and VRT modes. The patient received 80 cc of contrast.  FINDINGS: Aortic Valve: Severely calcified  and trileaflet  Aorta: Bovine Arch: Mild calcification of the inferior surface of the arch No significant aneurysm or plaque  Sinotubular Junction: 30 mm  Ascending Thoracic Aorta: 32 m  Aortic Arch: 25 mm  Descending Thoracic Aorta: 21 mm  Sinus of Valsalva Measurements:  Non-coronary: 33.7 mm  Right -coronary: 33 mm  Left -coronary: 33 mm  Coronary Artery Height above Annulus:  Left Main: 15.6 mm  Right Coronary: 15.9 mm  Virtual Basal Annulus Measurements:  Maximum/Minimum Diameter: 21.3 x 26.6 mm  Perimeter: 78.6 mm  Area: 459 mm2  Coronary Arteries: Occluded native vessels. Patent LIMA to LAD, Patent SVG to PDA and Patent sequential SVG to OM1/OM2  Optimum Fluoroscopic Angle for Delivery: LAO 26 degrees and Cranial 1 degree  IMPRESSION: 1) Calcified Trileaflet Aortic Valve suitable for 26 mm Sapien 3 valve Area of Annulus 459 mm2  2) Native coronary arteries occluded suitable ostial heights above annulus for delivery  3) Patent SVG PDA, Sequential graft to OM1/OM2 and LIMA to LAD  4) No root or arch contraindications to delivery of stented valve  5) Optimum angiographic angle for delivery LAO 26 degrees Cranial 1 degree  6) Prominent pectinate fibers no LAA thrombus  Charlton Haws   Electronically Signed  By: Charlton Haws M.D.  On: 01/30/2015 16:03      Study Result     EXAM: OVER-READ INTERPRETATION CT CHEST  The following report is an over-read performed by radiologist Dr. Royal Piedra [facility] on 01/30/2015. This over-read does not include interpretation of cardiac or coronary anatomy or pathology. The coronary calcium score/coronary CTA interpretation by the cardiologist is attached.  COMPARISON: No priors.  FINDINGS: A contemporaneous CTA of the chest, abdomen and pelvis was performed today.  IMPRESSION: Please refer to dedicated dictation for  contemporaneously obtained CTA of the chest, abdomen and pelvis 01/30/2015 for full description of extracardiac findings.  Electronically Signed: By: Trudie Reed M.D. On: 01/30/2015 11:10      Result History     CT Coronary Morp W/Cta Cor W/Score W/Ca W/Cm &/Or Wo/Cm (Order #[id number]) on 01/30/2015 - Order Result History Report  Vitals     Height Weight BMI (Calculated)    5\' 9"  (1.753 m) 147 lb (66.679 kg) 21.8      Interpretation Summary     EXAM: OVER-READ INTERPRETATION CT CHEST  The following report is an over-read performed by radiologist Dr. Royal Piedra [facility] on 01/30/2015. This over-read does not include interpretation of cardiac or coronary anatomy or pathology. The coronary calcium score/coronary CTA interpretation by the cardiologist is attached.  COMPARISON: No priors.  FINDINGS: A contemporaneous CTA of the chest, abdomen and pelvis was performed today.  IMPRESSION: Please refer to dedicated dictation for contemporaneously obtained CTA of the chest, abdomen and pelvis 01/30/2015 for full description of extracardiac findings.  Electronically Signed: By: Trudie Reed M.D. On: 01/30/2015 11:10     CLINICAL DATA: 74 year old male with history of severe aortic stenosis. Preprocedural study prior to potential transcatheter aortic valve replacement (TAVR).  EXAM: CT ANGIOGRAPHY CHEST, ABDOMEN AND PELVIS  TECHNIQUE: Multidetector CT imaging through the chest, abdomen and pelvis was performed using the standard protocol during bolus administration of intravenous contrast. Multiplanar reconstructed images and MIPs were obtained and reviewed to evaluate the vascular anatomy.  CONTRAST: OMNIPAQUE IOHEXOL 350 MG/ML SOLN  COMPARISON: None.  FINDINGS: CTA CHEST FINDINGS  Mediastinum/Lymph Nodes: Heart size is borderline enlarged. There is no significant pericardial fluid,  thickening or pericardial calcification. There is atherosclerosis of the thoracic aorta, the great vessels of the mediastinum and the coronary arteries, including calcified atherosclerotic plaque in the left main, left anterior descending, left circumflex and right coronary arteries. Status post median sternotomy for CABG, including saphenous vein grafts to the obtuse marginal distribution and PDA (both appear grossly patent) LIMA to the LAD (also grossly patent). Multiple prominent but nonenlarged mediastinal and hilar lymph nodes. Esophagus is unremarkable in appearance. No axillary lymphadenopathy.  Lungs/Pleura: Very unusual appearance in the lung parenchyma with extensive coarse calcifications predominantly throughout the periphery of the lung parenchyma. These findings have a craniocaudal gradient, most severe in the lung bases. There is some associated subpleural reticulation and peripheral peribronchovascular reticulation, with a small amount of peripheral traction bronchiectasis and bronchiolectasis. Some areas of potential honeycombing are identified in the lung bases (example on the right lower lobe on image 48 of series 407). No confluent consolidative airspace disease. No pleural effusions.  Musculoskeletal/Soft Tissues: Median sternotomy wires. There are no aggressive appearing lytic or blastic lesions noted in the visualized portions of the skeleton.  CTA ABDOMEN AND PELVIS FINDINGS  Hepatobiliary: No cystic for solid hepatic lesions. No intra or extrahepatic biliary ductal dilatation. Gallbladder is normal in appearance.  Pancreas: No pancreatic mass. No pancreatic ductal dilatation. No pancreatic or peripancreatic fluid or inflammatory changes.  Spleen: Unremarkable.  Adrenals/Urinary Tract: Normal appearance of the adrenal glands bilaterally. In the lower pole of the right kidney in the anterior aspect of the upper pole of the left kidney there are  intermediate attenuation lesions (26-32 HU), which are incompletely characterized, but likely to represent cysts with mildly proteinaceous contents, largest of which is in the upper pole of the left kidney measuring 3.4 cm in diameter. No hydroureteronephrosis. Urinary bladder is normal in appearance.  Stomach/Bowel: Normal appearance of the stomach. 2.9 cm diverticulum from the second portion of the duodenum incidentally noted. No surrounding inflammatory changes. No significant volume of ascites. No pneumoperitoneum. No pathologic distention of small bowel. A few scattered colonic diverticulae are noted, without surrounding inflammatory changes to suggest an acute diverticulitis at this time. Normal appendix.  Vascular/Lymphatic: Vascular findings and measurements pertinent to potential TAVR procedure, as discussed below. Also, as mentioned below, there appears to be a small intimal injury an luminal thrombus associated with the proximal right superficial femoral artery. Celiac axis, superior mesenteric artery, inferior mesenteric artery, and their major branches all appear widely patent at this time. Single left renal artery. Tiny upper pole accessory right renal artery (generally, the right kidney is fed by a dominant single right renal artery).  Reproductive: Prostate gland and seminal vesicles are unremarkable in appearance.  Other: No significant volume of ascites. No pneumoperitoneum.  Musculoskeletal: There are no aggressive appearing lytic or blastic lesions noted in the visualized portions of the skeleton.  VASCULAR MEASUREMENTS PERTINENT TO TAVR:  AORTA:  Minimal Aortic Diameter - 12 x 10 mm  Severity of Aortic Calcification - moderate to severe  RIGHT PELVIS:  Right Common Iliac Artery -  Minimal Diameter - 5.9 x 8.9 mm  Tortuosity - mild  Calcification - moderate  Right External Iliac Artery -  Minimal Diameter - 6.9 x 6.7  mm  Tortuosity - mild  Calcification - none  Right Common Femoral Artery -  Minimal Diameter - 7.4 x 7.4 mm  Tortuosity - mild  Calcification - none  Right Superficial Femoral Artery - In the proximal aspect of the right superficial femoral artery (image 589 of series 402) there is an intimal abnormality and small filling defect, likely to represent a small amount of adherent thrombus related to recent catheterization procedure at outside facility.  LEFT PELVIS:  Left Common Iliac Artery -  Minimal Diameter - 6.9 x 7.6 mm  Tortuosity - mild  Calcification - mild to moderate  Left External Iliac Artery -  Minimal Diameter - 7.7 x 7.2 mm  Tortuosity - mild  Calcification - none  Left Common Femoral Artery -  Minimal Diameter - 6.8 x 5.3 mm  Tortuosity - mild  Calcification - mild  Review of the MIP images confirms the above findings.  IMPRESSION: 1. Vascular findings and measurements pertinent to potential TAVR procedure, as detailed above. This patient does appear to have suitable pelvic arterial access bilaterally. 2. Small intimal injury and small amount of luminal thrombus in the proximal right superficial femoral artery, presumably related to recent outside catheterization procedure. 3. The appearance of the lungs is highly unusual in this patient. Specifically, there is a large amount of predominantly peripheral parenchymal calcification. In addition, there are findings suggestive of underlying interstitial lung disease. Given the strong craniocaudal gradient, and the presence of what appears to be some early peripheral honeycombing, findings are most concerning for potential usual interstitial pneumonia (UIP), in which case, the parenchymal calcifications may a represent a form of disseminated pulmonary ossification or potentially metastatic calcification. Alternatively, similar findings can be seen in the setting of primary  pulmonary amyloidosis, however, that is a relatively rare diagnosis, and the appearance of the parenchymal calcifications in this particular setting would be unusual for amyloidosis. Other etiologies such as silicosis are not favored given the distribution of calcifications in this instance. Nonemergent referral to Pulmonology for further evaluation is recommended. 4. Thickened and calcified aortic valve, compatible with the patient's reported clinical history of severe aortic stenosis. 5. Atherosclerosis, including left main and 3 vessel coronary artery disease. Status post median sternotomy for CABG, including SVGs to OM and PDA distribution, and LIMA to the LAD, all of which appear to be grossly patent. 6. Small cysts in the kidneys bilaterally, which appear to be mildly  proteinaceous. These could be further evaluated with nonemergent renal ultrasound in 6 months to confirm the benign nature of this finding and ensure stability if clinically appropriate.   Electronically Signed  By: Trudie Reed M.D.  On: 01/30/2015 12:32   Impression:  Mr. Oquendo is a 74 year old gentleman with stage D severe aortic stenosis. He underwent CABG in 2005 and recent cath shows that his bypass grafts are patent without significant stenosis. I have personally reviewed his echo and cath images and agree that he has severe aortic stenosis with severe calcification and restriction of his aortic leaflets, a calculated AVA between 0.6 and 0.8 square cm, and a dimensionless index of 0.17. I have reviewed the natural history of aortic stenosis with the patient and his family members who are present today. We have discussed the limitations of medical therapy and the poor prognosis associated with symptomatic aortic stenosis. We have also reviewed potential treatment options, including palliative medical therapy, conventional surgical aortic valve replacement, and transcatheter aortic valve replacement. I think  he would be a very high risk patient for open surgical AVR due to his age, prior CABG and underlying neurologic dysfunction which looks like Parkinson's disease, although he does not have that diagnosis. I think TAVR would be the best option for treating his severe aortic stenosis. I have personally reviewed his CT studies and he is a candidate for a 26 mm Sapien 3 valve. His pelvic arterial vasculature is adequate for transfemoral access. I reviewed the surgical procedure of TAVR with the patient, his son and wife including a variety of complications that might develop including but not limited to risks of death, stroke, paravalvular leak, aortic dissection or other major vascular complications, aortic annulus rupture, device embolization, cardiac rupture or perforation, mitral regurgitation, acute myocardial infarction, arrhythmia, heart block or bradycardia requiring permanent pacemaker placement, congestive heart failure, respiratory failure, renal failure, pneumonia, infection, other late complications related to structural valve deterioration or migration, or other complications that might ultimately cause a temporary or permanent loss of functional independence or other long term morbidity.He would like to proceed.   Plan:  He will be scheduled for transfemoral TAVR on 02/05/2015.    Alleen Borne, MD 01/30/2015

## 2015-02-04 ENCOUNTER — Other Ambulatory Visit (HOSPITAL_COMMUNITY): Payer: Self-pay | Admitting: Respiratory Therapy

## 2015-02-04 MED ORDER — SODIUM CHLORIDE 0.9 % IV SOLN
INTRAVENOUS | Status: DC
  Filled 2015-02-04: qty 2.5

## 2015-02-04 MED ORDER — POTASSIUM CHLORIDE 2 MEQ/ML IV SOLN
80.0000 meq | INTRAVENOUS | Status: DC
  Filled 2015-02-04: qty 40

## 2015-02-04 MED ORDER — NOREPINEPHRINE BITARTRATE 1 MG/ML IV SOLN
0.0000 ug/min | INTRAVENOUS | Status: DC
  Filled 2015-02-04: qty 4

## 2015-02-04 MED ORDER — EPINEPHRINE HCL 1 MG/ML IJ SOLN
0.0000 ug/min | INTRAMUSCULAR | Status: DC
  Filled 2015-02-04: qty 4

## 2015-02-04 MED ORDER — DEXMEDETOMIDINE HCL IN NACL 400 MCG/100ML IV SOLN
0.1000 ug/kg/h | INTRAVENOUS | Status: AC
  Administered 2015-02-05: .2 ug/kg/h via INTRAVENOUS
  Filled 2015-02-04: qty 100

## 2015-02-04 MED ORDER — PHENYLEPHRINE HCL 10 MG/ML IJ SOLN
30.0000 ug/min | INTRAVENOUS | Status: DC
  Filled 2015-02-04: qty 2

## 2015-02-04 MED ORDER — DEXTROSE 5 % IV SOLN
1.5000 g | INTRAVENOUS | Status: AC
  Administered 2015-02-05: 1.5 g via INTRAVENOUS
  Filled 2015-02-04: qty 1.5

## 2015-02-04 MED ORDER — MAGNESIUM SULFATE 50 % IJ SOLN
40.0000 meq | INTRAMUSCULAR | Status: DC
  Filled 2015-02-04: qty 10

## 2015-02-04 MED ORDER — SODIUM CHLORIDE 0.9 % IV SOLN
INTRAVENOUS | Status: DC
  Filled 2015-02-04: qty 30

## 2015-02-04 MED ORDER — DOPAMINE-DEXTROSE 3.2-5 MG/ML-% IV SOLN
0.0000 ug/kg/min | INTRAVENOUS | Status: DC
  Filled 2015-02-04: qty 250

## 2015-02-04 MED ORDER — NITROGLYCERIN IN D5W 200-5 MCG/ML-% IV SOLN
2.0000 ug/min | INTRAVENOUS | Status: DC
  Filled 2015-02-04: qty 250

## 2015-02-04 MED ORDER — VANCOMYCIN HCL 10 G IV SOLR
1250.0000 mg | INTRAVENOUS | Status: AC
  Administered 2015-02-05: 1250 mg via INTRAVENOUS
  Filled 2015-02-04: qty 1250

## 2015-02-05 ENCOUNTER — Encounter (HOSPITAL_COMMUNITY)
Admission: RE | Disposition: A | Discharge status: Home / Self Care | Payer: Medicare Other | Source: Ambulatory Visit | Attending: Cardiovascular Disease

## 2015-02-05 ENCOUNTER — Inpatient Hospital Stay (HOSPITAL_COMMUNITY): Payer: Medicare Other

## 2015-02-05 ENCOUNTER — Inpatient Hospital Stay (HOSPITAL_COMMUNITY): Payer: Medicare Other | Admitting: Anesthesiology

## 2015-02-05 ENCOUNTER — Encounter (HOSPITAL_COMMUNITY): Payer: Self-pay | Admitting: *Deleted

## 2015-02-05 ENCOUNTER — Inpatient Hospital Stay (HOSPITAL_COMMUNITY)
Admission: RE | Admit: 2015-02-05 | Discharge: 2015-02-08 | DRG: 267 | Disposition: A | Discharge status: Home / Self Care | Payer: Medicare Other | Source: Ambulatory Visit | Attending: Cardiovascular Disease | Admitting: Cardiovascular Disease

## 2015-02-05 ENCOUNTER — Other Ambulatory Visit (HOSPITAL_COMMUNITY): Payer: Self-pay | Admitting: Respiratory Therapy

## 2015-02-05 DIAGNOSIS — I350 Nonrheumatic aortic (valve) stenosis: Principal | ICD-10-CM

## 2015-02-05 DIAGNOSIS — J984 Other disorders of lung: Secondary | ICD-10-CM | POA: Diagnosis present

## 2015-02-05 DIAGNOSIS — D62 Acute posthemorrhagic anemia: Secondary | ICD-10-CM | POA: Diagnosis not present

## 2015-02-05 DIAGNOSIS — I359 Nonrheumatic aortic valve disorder, unspecified: Secondary | ICD-10-CM | POA: Diagnosis not present

## 2015-02-05 DIAGNOSIS — I2510 Atherosclerotic heart disease of native coronary artery without angina pectoris: Secondary | ICD-10-CM | POA: Diagnosis present

## 2015-02-05 DIAGNOSIS — Z953 Presence of xenogenic heart valve: Secondary | ICD-10-CM

## 2015-02-05 DIAGNOSIS — Z951 Presence of aortocoronary bypass graft: Secondary | ICD-10-CM | POA: Diagnosis not present

## 2015-02-05 DIAGNOSIS — I2582 Chronic total occlusion of coronary artery: Secondary | ICD-10-CM | POA: Diagnosis present

## 2015-02-05 DIAGNOSIS — I480 Paroxysmal atrial fibrillation: Secondary | ICD-10-CM | POA: Diagnosis not present

## 2015-02-05 DIAGNOSIS — Z006 Encounter for examination for normal comparison and control in clinical research program: Secondary | ICD-10-CM

## 2015-02-05 DIAGNOSIS — E440 Moderate protein-calorie malnutrition: Secondary | ICD-10-CM | POA: Diagnosis present

## 2015-02-05 DIAGNOSIS — I4891 Unspecified atrial fibrillation: Secondary | ICD-10-CM | POA: Diagnosis not present

## 2015-02-05 DIAGNOSIS — Z882 Allergy status to sulfonamides status: Secondary | ICD-10-CM

## 2015-02-05 DIAGNOSIS — Z79899 Other long term (current) drug therapy: Secondary | ICD-10-CM

## 2015-02-05 DIAGNOSIS — K219 Gastro-esophageal reflux disease without esophagitis: Secondary | ICD-10-CM | POA: Diagnosis present

## 2015-02-05 DIAGNOSIS — Z7982 Long term (current) use of aspirin: Secondary | ICD-10-CM

## 2015-02-05 DIAGNOSIS — Z8249 Family history of ischemic heart disease and other diseases of the circulatory system: Secondary | ICD-10-CM | POA: Diagnosis not present

## 2015-02-05 DIAGNOSIS — R54 Age-related physical debility: Secondary | ICD-10-CM | POA: Diagnosis present

## 2015-02-05 DIAGNOSIS — G250 Essential tremor: Secondary | ICD-10-CM | POA: Diagnosis present

## 2015-02-05 DIAGNOSIS — Z87891 Personal history of nicotine dependence: Secondary | ICD-10-CM | POA: Diagnosis not present

## 2015-02-05 DIAGNOSIS — E785 Hyperlipidemia, unspecified: Secondary | ICD-10-CM | POA: Diagnosis present

## 2015-02-05 DIAGNOSIS — I10 Essential (primary) hypertension: Secondary | ICD-10-CM | POA: Diagnosis present

## 2015-02-05 DIAGNOSIS — Z7951 Long term (current) use of inhaled steroids: Secondary | ICD-10-CM | POA: Diagnosis not present

## 2015-02-05 DIAGNOSIS — Z6821 Body mass index (BMI) 21.0-21.9, adult: Secondary | ICD-10-CM

## 2015-02-05 DIAGNOSIS — I9789 Other postprocedural complications and disorders of the circulatory system, not elsewhere classified: Secondary | ICD-10-CM | POA: Diagnosis not present

## 2015-02-05 HISTORY — PX: TRANSCATHETER AORTIC VALVE REPLACEMENT, TRANSFEMORAL: SHX6400

## 2015-02-05 HISTORY — PX: TEE WITHOUT CARDIOVERSION: SHX5443

## 2015-02-05 LAB — POCT I-STAT 3, ART BLOOD GAS (G3+)
ACID-BASE DEFICIT: 1 mmol/L (ref 0.0–2.0)
Bicarbonate: 24.5 mEq/L — ABNORMAL HIGH (ref 20.0–24.0)
O2 Saturation: 99 %
PCO2 ART: 41.2 mmHg (ref 35.0–45.0)
PH ART: 7.376 (ref 7.350–7.450)
TCO2: 26 mmol/L (ref 0–100)
pO2, Arterial: 126 mmHg — ABNORMAL HIGH (ref 80.0–100.0)

## 2015-02-05 LAB — CBC
HEMATOCRIT: 33.7 % — AB (ref 39.0–52.0)
Hemoglobin: 11.2 g/dL — ABNORMAL LOW (ref 13.0–17.0)
MCH: 30.4 pg (ref 26.0–34.0)
MCHC: 33.2 g/dL (ref 30.0–36.0)
MCV: 91.3 fL (ref 78.0–100.0)
Platelets: 130 10*3/uL — ABNORMAL LOW (ref 150–400)
RBC: 3.69 MIL/uL — AB (ref 4.22–5.81)
RDW: 13 % (ref 11.5–15.5)
WBC: 7.7 10*3/uL (ref 4.0–10.5)

## 2015-02-05 LAB — PREPARE RBC (CROSSMATCH)

## 2015-02-05 LAB — POCT I-STAT 4, (NA,K, GLUC, HGB,HCT)
Glucose, Bld: 118 mg/dL — ABNORMAL HIGH (ref 65–99)
HCT: 32 % — ABNORMAL LOW (ref 39.0–52.0)
Hemoglobin: 10.9 g/dL — ABNORMAL LOW (ref 13.0–17.0)
Potassium: 4 mmol/L (ref 3.5–5.1)
Sodium: 137 mmol/L (ref 135–145)

## 2015-02-05 LAB — GLUCOSE, CAPILLARY
GLUCOSE-CAPILLARY: 124 mg/dL — AB (ref 65–99)
Glucose-Capillary: 101 mg/dL — ABNORMAL HIGH (ref 65–99)

## 2015-02-05 LAB — PROTIME-INR
INR: 1.38 (ref 0.00–1.49)
PROTHROMBIN TIME: 17.1 s — AB (ref 11.6–15.2)

## 2015-02-05 LAB — APTT: APTT: 31 s (ref 24–37)

## 2015-02-05 SURGERY — IMPLANTATION, AORTIC VALVE, TRANSCATHETER, FEMORAL APPROACH
Anesthesia: General | Site: Chest

## 2015-02-05 MED ORDER — CHLORHEXIDINE GLUCONATE 4 % EX LIQD: 30.0000 mL | CUTANEOUS | Status: DC

## 2015-02-05 MED ORDER — LACTATED RINGERS IV SOLN: 500.0000 mL | Freq: Once | INTRAVENOUS | Status: AC | PRN

## 2015-02-05 MED ORDER — HEPARIN SODIUM (PORCINE) 1000 UNIT/ML IJ SOLN
INTRAMUSCULAR | Status: DC | PRN
  Administered 2015-02-05: 9000 [IU] via INTRAVENOUS

## 2015-02-05 MED ORDER — NITROGLYCERIN IN D5W 200-5 MCG/ML-% IV SOLN: 0.0000 ug/min | INTRAVENOUS | Status: DC

## 2015-02-05 MED ORDER — ASPIRIN EC 81 MG PO TBEC
81.0000 mg | DELAYED_RELEASE_TABLET | Freq: Every day | ORAL | Status: DC
  Administered 2015-02-05 – 2015-02-08 (×4): 81 mg via ORAL
  Filled 2015-02-05 (×4): qty 1

## 2015-02-05 MED ORDER — DEXTROSE 5 % IV SOLN
1.5000 g | Freq: Two times a day (BID) | INTRAVENOUS | Status: DC
  Administered 2015-02-05: 1.5 g via INTRAVENOUS
  Filled 2015-02-05 (×3): qty 1.5

## 2015-02-05 MED ORDER — CHLORHEXIDINE GLUCONATE 4 % EX LIQD: 1.0000 "application " | Freq: Once | CUTANEOUS | Status: DC

## 2015-02-05 MED ORDER — METOPROLOL TARTRATE 12.5 MG HALF TABLET
12.5000 mg | ORAL_TABLET | Freq: Two times a day (BID) | ORAL | Status: DC
  Filled 2015-02-05 (×3): qty 1

## 2015-02-05 MED ORDER — GLYCOPYRROLATE 0.2 MG/ML IJ SOLN
INTRAMUSCULAR | Status: DC | PRN
  Administered 2015-02-05: 0.6 mg via INTRAVENOUS

## 2015-02-05 MED ORDER — PROTAMINE SULFATE 10 MG/ML IV SOLN
INTRAVENOUS | Status: DC | PRN
Start: 2015-02-05 — End: 2015-02-05
  Administered 2015-02-05: 10 mg via INTRAVENOUS
  Administered 2015-02-05 (×6): 20 mg via INTRAVENOUS

## 2015-02-05 MED ORDER — NEOSTIGMINE METHYLSULFATE 10 MG/10ML IV SOLN
INTRAVENOUS | Status: DC | PRN
  Administered 2015-02-05: 4 mg via INTRAVENOUS

## 2015-02-05 MED ORDER — PROPOFOL 10 MG/ML IV BOLUS
INTRAVENOUS | Status: AC
  Filled 2015-02-05: qty 20

## 2015-02-05 MED ORDER — ALBUMIN HUMAN 5 % IV SOLN
INTRAVENOUS | Status: DC | PRN
  Administered 2015-02-05 (×2): via INTRAVENOUS

## 2015-02-05 MED ORDER — FENTANYL CITRATE (PF) 100 MCG/2ML IJ SOLN
50.0000 ug | Freq: Once | INTRAMUSCULAR | Status: AC
  Administered 2015-02-05: 50 ug via INTRAVENOUS

## 2015-02-05 MED ORDER — LACTATED RINGERS IV SOLN
INTRAVENOUS | Status: DC
  Administered 2015-02-05: 11:00:00 via INTRAVENOUS

## 2015-02-05 MED ORDER — IODIXANOL 320 MG/ML IV SOLN
INTRAVENOUS | Status: DC | PRN
  Administered 2015-02-05: 59.9 mL via INTRA_ARTERIAL

## 2015-02-05 MED ORDER — SODIUM CHLORIDE 0.9 % IV SOLN: 250.0000 mL | INTRAVENOUS | Status: DC | PRN

## 2015-02-05 MED ORDER — MIDAZOLAM HCL 2 MG/2ML IJ SOLN: 2.0000 mg | INTRAMUSCULAR | Status: DC | PRN

## 2015-02-05 MED ORDER — PHENYLEPHRINE HCL 10 MG/ML IJ SOLN
20.0000 mg | INTRAVENOUS | Status: DC | PRN
  Administered 2015-02-05: 10 ug/min via INTRAVENOUS

## 2015-02-05 MED ORDER — INSULIN ASPART 100 UNIT/ML ~~LOC~~ SOLN
0.0000 [IU] | SUBCUTANEOUS | Status: DC
  Administered 2015-02-05 – 2015-02-06 (×3): 2 [IU] via SUBCUTANEOUS

## 2015-02-05 MED ORDER — METOPROLOL TARTRATE 25 MG/10 ML ORAL SUSPENSION
12.5000 mg | Freq: Two times a day (BID) | ORAL | Status: DC
  Filled 2015-02-05 (×3): qty 5

## 2015-02-05 MED ORDER — FENTANYL CITRATE (PF) 100 MCG/2ML IJ SOLN
INTRAMUSCULAR | Status: DC | PRN
  Administered 2015-02-05: 150 ug via INTRAVENOUS

## 2015-02-05 MED ORDER — ACETAMINOPHEN 650 MG RE SUPP: 650.0000 mg | Freq: Once | RECTAL | Status: DC

## 2015-02-05 MED ORDER — MORPHINE SULFATE 2 MG/ML IJ SOLN: 1.0000 mg | INTRAMUSCULAR | Status: DC | PRN

## 2015-02-05 MED ORDER — INSULIN REGULAR BOLUS VIA INFUSION
0.0000 [IU] | Freq: Three times a day (TID) | INTRAVENOUS | Status: DC
Start: 2015-02-05 — End: 2015-02-05
  Filled 2015-02-05: qty 10

## 2015-02-05 MED ORDER — FENTANYL CITRATE (PF) 250 MCG/5ML IJ SOLN
INTRAMUSCULAR | Status: AC
  Filled 2015-02-05: qty 5

## 2015-02-05 MED ORDER — MORPHINE SULFATE 2 MG/ML IJ SOLN: 2.0000 mg | INTRAMUSCULAR | Status: DC | PRN

## 2015-02-05 MED ORDER — OXYCODONE HCL 5 MG PO TABS
5.0000 mg | ORAL_TABLET | ORAL | Status: DC | PRN
  Administered 2015-02-05 – 2015-02-06 (×2): 5 mg via ORAL
  Filled 2015-02-05 (×2): qty 1

## 2015-02-05 MED ORDER — SODIUM CHLORIDE 0.9 % IJ SOLN
3.0000 mL | Freq: Two times a day (BID) | INTRAMUSCULAR | Status: DC
Start: 2015-02-05 — End: 2015-02-06
  Administered 2015-02-05 – 2015-02-06 (×2): 3 mL via INTRAVENOUS

## 2015-02-05 MED ORDER — FENTANYL CITRATE (PF) 100 MCG/2ML IJ SOLN
INTRAMUSCULAR | Status: AC
  Administered 2015-02-05: 50 ug via INTRAVENOUS
  Filled 2015-02-05: qty 2

## 2015-02-05 MED ORDER — PRIMIDONE 50 MG PO TABS
50.0000 mg | ORAL_TABLET | Freq: Two times a day (BID) | ORAL | Status: DC
  Administered 2015-02-05 – 2015-02-08 (×6): 50 mg via ORAL
  Filled 2015-02-05 (×8): qty 1

## 2015-02-05 MED ORDER — DEXTROSE 5 % IV SOLN
4000.0000 ug | INTRAVENOUS | Status: DC | PRN
  Administered 2015-02-05: 2 ug/min via INTRAVENOUS

## 2015-02-05 MED ORDER — CLOPIDOGREL BISULFATE 75 MG PO TABS
75.0000 mg | ORAL_TABLET | Freq: Every day | ORAL | Status: DC
Start: 2015-02-06 — End: 2015-02-08
  Administered 2015-02-06 – 2015-02-08 (×3): 75 mg via ORAL
  Filled 2015-02-05 (×5): qty 1

## 2015-02-05 MED ORDER — FAMOTIDINE IN NACL 20-0.9 MG/50ML-% IV SOLN
20.0000 mg | Freq: Two times a day (BID) | INTRAVENOUS | Status: DC
  Administered 2015-02-05: 20 mg via INTRAVENOUS
  Filled 2015-02-05: qty 50

## 2015-02-05 MED ORDER — ACETAMINOPHEN 500 MG PO TABS
1000.0000 mg | ORAL_TABLET | Freq: Four times a day (QID) | ORAL | Status: DC
  Administered 2015-02-05 – 2015-02-06 (×2): 1000 mg via ORAL
  Filled 2015-02-05 (×6): qty 2

## 2015-02-05 MED ORDER — SODIUM CHLORIDE 0.9 % IJ SOLN: 3.0000 mL | INTRAMUSCULAR | Status: DC | PRN

## 2015-02-05 MED ORDER — PHENYLEPHRINE HCL 10 MG/ML IJ SOLN
0.0000 ug/min | INTRAVENOUS | Status: DC
  Filled 2015-02-05: qty 2

## 2015-02-05 MED ORDER — TRAMADOL HCL 50 MG PO TABS: 50.0000 mg | ORAL_TABLET | ORAL | Status: DC | PRN

## 2015-02-05 MED ORDER — MIDAZOLAM HCL 2 MG/2ML IJ SOLN
1.0000 mg | Freq: Once | INTRAMUSCULAR | Status: AC
  Administered 2015-02-05: 1 mg via INTRAVENOUS

## 2015-02-05 MED ORDER — ONDANSETRON HCL 4 MG/2ML IJ SOLN
INTRAMUSCULAR | Status: DC | PRN
  Administered 2015-02-05: 4 mg via INTRAVENOUS

## 2015-02-05 MED ORDER — SODIUM CHLORIDE 0.9 % IV SOLN
1.0000 mL/kg/h | INTRAVENOUS | Status: AC
  Administered 2015-02-05: 1 mL/kg/h via INTRAVENOUS

## 2015-02-05 MED ORDER — SODIUM CHLORIDE 0.9 % IV SOLN
INTRAVENOUS | Status: DC
  Filled 2015-02-05: qty 2.5

## 2015-02-05 MED ORDER — AMIODARONE HCL IN DEXTROSE 360-4.14 MG/200ML-% IV SOLN
30.0000 mg/h | INTRAVENOUS | Status: DC
  Filled 2015-02-05 (×3): qty 200

## 2015-02-05 MED ORDER — ONDANSETRON HCL 4 MG/2ML IJ SOLN: 4.0000 mg | Freq: Four times a day (QID) | INTRAMUSCULAR | Status: DC | PRN

## 2015-02-05 MED ORDER — DEXMEDETOMIDINE HCL IN NACL 200 MCG/50ML IV SOLN: 0.1000 ug/kg/h | INTRAVENOUS | Status: DC

## 2015-02-05 MED ORDER — HEPARIN SODIUM (PORCINE) 5000 UNIT/ML IJ SOLN
INTRAMUSCULAR | Status: DC | PRN
  Administered 2015-02-05: 1500 mL

## 2015-02-05 MED ORDER — VANCOMYCIN HCL IN DEXTROSE 1-5 GM/200ML-% IV SOLN
1000.0000 mg | Freq: Once | INTRAVENOUS | Status: AC
  Administered 2015-02-05: 1000 mg via INTRAVENOUS
  Filled 2015-02-05: qty 200

## 2015-02-05 MED ORDER — ROCURONIUM BROMIDE 100 MG/10ML IV SOLN
INTRAVENOUS | Status: DC | PRN
  Administered 2015-02-05: 50 mg via INTRAVENOUS

## 2015-02-05 MED ORDER — METOPROLOL TARTRATE 1 MG/ML IV SOLN
2.5000 mg | INTRAVENOUS | Status: DC | PRN
  Administered 2015-02-05: 5 mg via INTRAVENOUS
  Filled 2015-02-05: qty 5

## 2015-02-05 MED ORDER — MIDAZOLAM HCL 2 MG/2ML IJ SOLN
INTRAMUSCULAR | Status: AC
  Administered 2015-02-05: 1 mg via INTRAVENOUS
  Filled 2015-02-05: qty 2

## 2015-02-05 MED ORDER — PROPOFOL 10 MG/ML IV BOLUS
INTRAVENOUS | Status: DC | PRN
  Administered 2015-02-05: 55 mg via INTRAVENOUS

## 2015-02-05 MED ORDER — AMIODARONE LOAD VIA INFUSION
150.0000 mg | Freq: Once | INTRAVENOUS | Status: AC
  Administered 2015-02-05: 150 mg via INTRAVENOUS
  Filled 2015-02-05: qty 83.34

## 2015-02-05 MED ORDER — PANTOPRAZOLE SODIUM 40 MG PO TBEC
40.0000 mg | DELAYED_RELEASE_TABLET | Freq: Every day | ORAL | Status: DC
  Administered 2015-02-06: 40 mg via ORAL
  Filled 2015-02-05: qty 1

## 2015-02-05 MED ORDER — ACETAMINOPHEN 160 MG/5ML PO SOLN
1000.0000 mg | Freq: Four times a day (QID) | ORAL | Status: DC
  Filled 2015-02-05: qty 40

## 2015-02-05 MED ORDER — AMIODARONE HCL IN DEXTROSE 360-4.14 MG/200ML-% IV SOLN
60.0000 mg/h | INTRAVENOUS | Status: AC
  Administered 2015-02-05 (×2): 60 mg/h via INTRAVENOUS
  Filled 2015-02-05 (×2): qty 200

## 2015-02-05 MED ORDER — ACETAMINOPHEN 160 MG/5ML PO SOLN: 650.0000 mg | Freq: Once | ORAL | Status: DC

## 2015-02-05 SURGICAL SUPPLY — 102 items
ADH SKN CLS APL DERMABOND .7 (GAUZE/BANDAGES/DRESSINGS) ×1
ARTERIAL PRESSURE LINE (MISCELLANEOUS) ×2 IMPLANT
ATTRACTOMAT 16X20 MAGNETIC DRP (DRAPES) IMPLANT
BAG BANDED W/RUBBER/TAPE 36X54 (MISCELLANEOUS) ×3 IMPLANT
BAG DECANTER FOR FLEXI CONT (MISCELLANEOUS) IMPLANT
BAG EQP BAND 135X91 W/RBR TAPE (MISCELLANEOUS) ×1
BAG SNAP BAND KOVER 36X36 (MISCELLANEOUS) ×6 IMPLANT
BLADE 10 SAFETY STRL DISP (BLADE) ×3 IMPLANT
BLADE STERNUM SYSTEM 6 (BLADE) ×3 IMPLANT
BLADE SURG ROTATE 9660 (MISCELLANEOUS) IMPLANT
CABLE PACING FASLOC BIEGE (MISCELLANEOUS) ×2 IMPLANT
CABLE PACING FASLOC BLUE (MISCELLANEOUS) ×3 IMPLANT
CANISTER SUCTION 2500CC (MISCELLANEOUS) IMPLANT
CANNULA FEM VENOUS REMOTE 22FR (CANNULA) IMPLANT
CANNULA OPTISITE PERFUSION 16F (CANNULA) IMPLANT
CANNULA OPTISITE PERFUSION 18F (CANNULA) IMPLANT
CATH DIAG EXPO 6F AL2 (CATHETERS) ×2 IMPLANT
CATH DIAG EXPO 6F VENT PIG 145 (CATHETERS) ×4 IMPLANT
CATH S G BIP PACING (SET/KITS/TRAYS/PACK) ×5 IMPLANT
CATH STRAIGHT 5FR 65CM (CATHETERS) ×4 IMPLANT
CLIP TI MEDIUM 24 (CLIP) ×3 IMPLANT
CLIP TI WIDE RED SMALL 24 (CLIP) ×3 IMPLANT
CONT SPEC 4OZ CLIKSEAL STRL BL (MISCELLANEOUS) ×2 IMPLANT
COVER DOME SNAP 22 D (MISCELLANEOUS) ×3 IMPLANT
COVER MAYO STAND STRL (DRAPES) ×3 IMPLANT
COVER TABLE BACK 60X90 (DRAPES) ×3 IMPLANT
CRADLE DONUT ADULT HEAD (MISCELLANEOUS) ×3 IMPLANT
DERMABOND ADVANCED (GAUZE/BANDAGES/DRESSINGS) ×2
DERMABOND ADVANCED .7 DNX12 (GAUZE/BANDAGES/DRESSINGS) ×1 IMPLANT
DRAPE INCISE IOBAN 66X45 STRL (DRAPES) IMPLANT
DRAPE SLUSH MACHINE 52X66 (DRAPES) ×3 IMPLANT
DRAPE TABLE COVER HEAVY DUTY (DRAPES) ×3 IMPLANT
DRSG TEGADERM 4X4.75 (GAUZE/BANDAGES/DRESSINGS) ×3 IMPLANT
ELECT REM PT RETURN 9FT ADLT (ELECTROSURGICAL) ×6
ELECTRODE REM PT RTRN 9FT ADLT (ELECTROSURGICAL) ×2 IMPLANT
FELT TEFLON 6X6 (MISCELLANEOUS) ×3 IMPLANT
FEMORAL VENOUS CANN RAP (CANNULA) IMPLANT
GAUZE SPONGE 4X4 12PLY STRL (GAUZE/BANDAGES/DRESSINGS) ×3 IMPLANT
GLOVE BIOGEL PI IND STRL 6.5 (GLOVE) IMPLANT
GLOVE BIOGEL PI INDICATOR 6.5 (GLOVE) ×2
GLOVE ECLIPSE 7.5 STRL STRAW (GLOVE) ×3 IMPLANT
GLOVE ECLIPSE 8.0 STRL XLNG CF (GLOVE) ×6 IMPLANT
GLOVE EUDERMIC 7 POWDERFREE (GLOVE) ×3 IMPLANT
GLOVE ORTHO TXT STRL SZ7.5 (GLOVE) ×3 IMPLANT
GOWN STRL REUS W/ TWL LRG LVL3 (GOWN DISPOSABLE) ×3 IMPLANT
GOWN STRL REUS W/ TWL XL LVL3 (GOWN DISPOSABLE) ×6 IMPLANT
GOWN STRL REUS W/TWL LRG LVL3 (GOWN DISPOSABLE) ×9
GOWN STRL REUS W/TWL XL LVL3 (GOWN DISPOSABLE) ×18
GUIDEWIRE SAF TJ AMPL .035X180 (WIRE) ×3 IMPLANT
GUIDEWIRE SAFE TJ AMPLATZ EXST (WIRE) ×2 IMPLANT
GUIDEWIRE STRAIGHT .035 260CM (WIRE) ×2 IMPLANT
INSERT FOGARTY 61MM (MISCELLANEOUS) ×3 IMPLANT
INSERT FOGARTY SM (MISCELLANEOUS) ×6 IMPLANT
INSERT FOGARTY XLG (MISCELLANEOUS) IMPLANT
KIT BASIN OR (CUSTOM PROCEDURE TRAY) ×3 IMPLANT
KIT DILATOR VASC 18G NDL (KITS) IMPLANT
KIT HEART LEFT (KITS) ×2 IMPLANT
KIT ROOM TURNOVER OR (KITS) ×3 IMPLANT
KIT SUCTION CATH 14FR (SUCTIONS) ×6 IMPLANT
LIQUID BAND (GAUZE/BANDAGES/DRESSINGS) ×2 IMPLANT
NDL PERC 18GX7CM (NEEDLE) ×1 IMPLANT
NEEDLE PERC 18GX7CM (NEEDLE) ×3 IMPLANT
NS IRRIG 1000ML POUR BTL (IV SOLUTION) ×9 IMPLANT
PACK AORTA (CUSTOM PROCEDURE TRAY) ×3 IMPLANT
PAD ARMBOARD 7.5X6 YLW CONV (MISCELLANEOUS) ×6 IMPLANT
PAD ELECT DEFIB RADIOL ZOLL (MISCELLANEOUS) ×3 IMPLANT
PATCH TACHOSII LRG 9.5X4.8 (VASCULAR PRODUCTS) IMPLANT
SHEATH PINNACLE 6F 10CM (SHEATH) ×2 IMPLANT
SPONGE GAUZE 4X4 12PLY STER LF (GAUZE/BANDAGES/DRESSINGS) ×2 IMPLANT
SPONGE LAP 4X18 X RAY DECT (DISPOSABLE) ×3 IMPLANT
STOPCOCK 4 WAY LG BORE MALE ST (IV SETS) ×2 IMPLANT
STOPCOCK MORSE 400PSI 3WAY (MISCELLANEOUS) ×7 IMPLANT
SUT ETHIBOND X763 2 0 SH 1 (SUTURE) ×3 IMPLANT
SUT GORETEX CV 4 TH 22 36 (SUTURE) ×3 IMPLANT
SUT GORETEX CV4 TH-18 (SUTURE) ×9 IMPLANT
SUT GORETEX TH-18 36 INCH (SUTURE) ×6 IMPLANT
SUT MNCRL AB 3-0 PS2 18 (SUTURE) ×3 IMPLANT
SUT PROLENE 3 0 SH1 36 (SUTURE) IMPLANT
SUT PROLENE 4 0 RB 1 (SUTURE) ×3
SUT PROLENE 4-0 RB1 .5 CRCL 36 (SUTURE) ×1 IMPLANT
SUT PROLENE 5 0 C 1 36 (SUTURE) ×6 IMPLANT
SUT PROLENE 6 0 C 1 30 (SUTURE) ×6 IMPLANT
SUT SILK  1 MH (SUTURE) ×2
SUT SILK 1 MH (SUTURE) ×1 IMPLANT
SUT SILK 2 0 SH CR/8 (SUTURE) IMPLANT
SUT VIC AB 2-0 CT1 27 (SUTURE) ×3
SUT VIC AB 2-0 CT1 TAPERPNT 27 (SUTURE) ×1 IMPLANT
SUT VIC AB 2-0 CTX 36 (SUTURE) IMPLANT
SUT VIC AB 3-0 SH 8-18 (SUTURE) ×6 IMPLANT
SUT VIC AB 3-0 X1 27 (SUTURE) ×2 IMPLANT
SYR 30ML LL (SYRINGE) ×6 IMPLANT
SYR 50ML LL SCALE MARK (SYRINGE) ×3 IMPLANT
TAPE CLOTH SURG 4X10 WHT LF (GAUZE/BANDAGES/DRESSINGS) ×2 IMPLANT
TOWEL OR 17X26 10 PK STRL BLUE (TOWEL DISPOSABLE) ×6 IMPLANT
TRANSDUCER W/STOPCOCK (MISCELLANEOUS) ×4 IMPLANT
TRAY FOLEY IC TEMP SENS 14FR (CATHETERS) ×3 IMPLANT
TUBE SUCT INTRACARD DLP 20F (MISCELLANEOUS) IMPLANT
TUBING HIGH PRESSURE 120CM (CONNECTOR) ×3 IMPLANT
UNIVERSAL ADAPTER ×2 IMPLANT
VALVE HEART TRANSCATH SZ3 26MM (Prosthesis & Implant Heart) ×2 IMPLANT
WIRE AMPLATZ SS-J .035X180CM (WIRE) ×2 IMPLANT
WIRE J 3MM .035X145CM (WIRE) ×2 IMPLANT

## 2015-02-05 NOTE — Anesthesia Postprocedure Evaluation (Signed)
Anesthesia Post Note  Patient: Brandon Scott  Procedure(s) Performed: Procedure(s) (LRB): TRANSCATHETER AORTIC VALVE REPLACEMENT, TRANSFEMORAL (N/A) TRANSESOPHAGEAL ECHOCARDIOGRAM (TEE) (N/A)  Anesthesia type: General  Patient location: ICU  Post pain: Pain level controlled  Post assessment: Post-op Vital signs reviewed  Last Vitals:  Filed Vitals:   02/05/15 1031  BP:   Pulse: 52  Temp:   Resp: 13    Post vital signs: stable  Level of consciousness: sedated  Complications: No apparent anesthesia complications

## 2015-02-05 NOTE — Progress Notes (Signed)
Metoprolol held DOS d/t heart rate of 54

## 2015-02-05 NOTE — Anesthesia Preprocedure Evaluation (Addendum)
Anesthesia Evaluation  Patient identified by MRN, date of birth, ID band Patient awake    Reviewed: Allergy & Precautions, NPO status , Patient's Chart, lab work & pertinent test results  Airway Mallampati: II  TM Distance: <3 FB Neck ROM: Full    Dental  (+) Teeth Intact, Dental Advisory Given   Pulmonary former smoker,    Pulmonary exam normal       Cardiovascular hypertension, + CAD and + CABG + Valvular Problems/Murmurs AS Rhythm:Regular + Systolic murmurs    Neuro/Psych TIAnegative psych ROS   GI/Hepatic Neg liver ROS, GERD-  ,  Endo/Other  negative endocrine ROS  Renal/GU negative Renal ROS     Musculoskeletal   Abdominal   Peds  Hematology   Anesthesia Other Findings   Reproductive/Obstetrics                            Anesthesia Physical Anesthesia Plan  ASA: IV  Anesthesia Plan: General   Post-op Pain Management:    Induction: Intravenous  Airway Management Planned: Oral ETT  Additional Equipment: Arterial line, 3D TEE and PA Cath  Intra-op Plan:   Post-operative Plan: Extubation in OR  Informed Consent:   Plan Discussed with:   Anesthesia Plan Comments:         Anesthesia Quick Evaluation

## 2015-02-05 NOTE — Op Note (Signed)
CARDIOTHORACIC SURGERY OPERATIVE NOTE  Date of Procedure:  02/05/2015  Preoperative Diagnosis: Severe Aortic Stenosis   Postoperative Diagnosis: Same   Procedure:    Transcatheter Aortic Valve Replacement - Right Transfemoral Approach  Edwards Sapien 3 Transcatheter Heart Valve (size 26 mm, model # 9600TFX, serial # [id number])   Co-Surgeons:  Alleen Borne, MD and [name], MD    Anesthesiologist:  Adonis Huguenin, MD  Echocardiographer:  Charlton Haws, MD  Pre-operative Echo Findings:   severe aortic stenosis   normal left ventricular systolic function   Post-operative Echo Findings:  no paravalvular leak  normal left ventricular systolic function       DETAILS OF THE OPERATIVE PROCEDURE  The majority of the procedure is documented separately in a procedure note by Dr. Excell Seltzer.   TRANSFEMORAL ACCESS:   A small incision is made in the right groin immediately over the common femoral artery. The subcutaneous tissues are divided with electrocautery and the anterior surface of the common femoral artery is identified. Sharp dissection is utilized to free up the artery proximally and distally and the vessel is encircled with a vessel loop.  A pair of CV-4 Gore-tex sutures are place as diamond-shaped purse-strings on the anterior surface of the femoral artery.  The patient is heparinized systemically and ACT verified > 250 seconds.  The common femoral artery is punctured using an  18 gauge needle and a soft J-tipped guidewire is passed into the common iliac artery under fluoroscopic guidance.  A 6 Fr straight diagnostic catheter is placed over the guidewire and the guidewire is removed.  An Amplatz super stiff guidewire is passed through the sheath into the descending thoracic aorta and the introducing diagnostic catheter is removed.  Serial dilators are passed over the guidewire under continuous fluoroscopic guidance, making certain that each dilator passes easily all of the  way into the distal abdominal aorta.  A 14 Fr Edwards E- sheath is passed over the guidewire into the abdominal aorta.  The introducing dilator is removed, the sheath is flushed with heparinized saline, and the sheath is secured to the skin.    FEMORAL SHEATH REMOVAL AND ARTERIAL CLOSURE:  After the completion of successful valve deployment as documented separately by Dr. Excell Seltzer, the femoral artery sheath is removed and the arteriotomy is closed using the previously placed Gore-tex purse-string sutures. Once the repair has been completed protamine was administered to reverse the anticoagulation. A digitally-subtracted arteriogram was not done since the artery was large and had no significant disease in it and had a strong pulse distal to the insertion site after the sutures were tied.  The incision is irrigated with saline solution and subsequently closed in multiple layers using absorbable suture.  The skin incision is closed using a subcuticular skin closure.     Alleen Borne, MD 02/05/2015 3:41 PM

## 2015-02-05 NOTE — Addendum Note (Signed)
Addendum  created 02/05/15 1443 by Edmonia Caprio, CRNA   Modules edited: Anesthesia Attestations

## 2015-02-05 NOTE — Transfer of Care (Signed)
Immediate Anesthesia Transfer of Care Note  Patient: Brandon Scott  Procedure(s) Performed: Procedure(s): TRANSCATHETER AORTIC VALVE REPLACEMENT, TRANSFEMORAL (N/A) TRANSESOPHAGEAL ECHOCARDIOGRAM (TEE) (N/A)  Patient Location: SICU  Anesthesia Type:General  Level of Consciousness: awake, alert  and patient cooperative  Airway & Oxygen Therapy: Patient connected to nasal cannula oxygen  Post-op Assessment: Report given to RN, Post -op Vital signs reviewed and stable and Patient moving all extremities X 4  Post vital signs: Reviewed and stable  Last Vitals:  Filed Vitals:   02/05/15 1031  BP:   Pulse: 52  Temp:   Resp: 13    Complications: No apparent anesthesia complications

## 2015-02-05 NOTE — Progress Notes (Signed)
  Echocardiogram Echocardiogram Transesophageal has been performed.  Brandon Scott 02/05/2015, 12:33 PM

## 2015-02-05 NOTE — Plan of Care (Signed)
Problem: Phase II - Intermediate Post-Op Goal: Advance Diet Outcome: Completed/Met Date Met:  02/05/15 Cardiac carb modified diet.

## 2015-02-05 NOTE — Progress Notes (Signed)
  Amiodarone Drug - Drug Interaction Consult Note  Recommendations: No changes to drug therapy needed at this time.  Amiodarone is metabolized by the cytochrome P450 system and therefore has the potential to cause many drug interactions. Amiodarone has an average plasma half-life of 50 days (range 20 to 100 days).   There is potential for drug interactions to occur several weeks or months after stopping treatment and the onset of drug interactions may be slow after initiating amiodarone.   []  Statins: Increased risk of myopathy. Simvastatin- restrict dose to  daily. Other statins: counsel patients to report any muscle pain or weakness immediately.   Anticoagulants: Amiodarone can increase anticoagulant effect. Consider warfarin dose reduction. Patients should be monitored closely and the dose of anticoagulant altered accordingly, remembering that amiodarone levels take several weeks to stabilize.   Antiepileptics: Amiodarone can increase plasma concentration of phenytoin, the dose should be reduced. Note that small changes in phenytoin dose can result in large changes in levels. Monitor patient and counsel on signs of toxicity.   Beta blockers: increased risk of bradycardia, AV block and myocardial depression. Sotalol - avoid concomitant use.    Calcium channel blockers (diltiazem and verapamil): increased risk of bradycardia, AV block and myocardial depression.    Cyclosporine: Amiodarone increases levels of cyclosporine. Reduced dose of cyclosporine is recommended.   Digoxin dose should be halved when amiodarone is started.   Diuretics: increased risk of cardiotoxicity if hypokalemia occurs.   Oral hypoglycemic agents (glyburide, glipizide, glimepiride): increased risk of hypoglycemia. Patient's glucose levels should be monitored closely when initiating amiodarone therapy.    Drugs that prolong the QT interval:  Torsades de pointes risk may be increased with  concurrent use - av ided. Marland Kitchen Antibiotics: e.g. fluoroquinolones, erythromycin. . Antiarrhythmics: e.g. quinidine, procainamide, disopyramide, sotalol. . Antipsychotics: e.g. phenothiazines, haloperidol.  . Lithium, tricyclic antidepressants, and methadone. Thank You,  Mickeal Skinner  02/05/2015 8:30 PM

## 2015-02-05 NOTE — Op Note (Signed)
[facility]  TAVR OPERATIVE NOTE   Date of Procedure:  02/05/2015  Preoperative Diagnosis: Severe Aortic Stenosis   Postoperative Diagnosis: Same   Procedure:   Transcatheter Aortic Valve Replacement - Transfemoral Approach  Edwards Sapien 3 THV (size 26 mm, model # 9600TFX, serial # [id number])   Co-Surgeons:  Evelene Croon, MD and [name], MD  Anesthesiologist:  Dr Krista Blue  Echocardiographer:  Dr Eden Emms  Pre-operative Echo Findings:  Severe severe aortic stenosis  Normal left ventricular systolic function  Post-operative Echo Findings:  No paravalvular leak  Normal left ventricular systolic function  BRIEF CLINICAL NOTE AND INDICATIONS FOR SURGERY  74 yo male with CAD and hx of multivessel CABG has developed progressive, Stage D, symptomatic aortic stenosis and now presents for TAVR after evaluation by the Multidisciplinary Heart Team. He has progressive weight loss, protein-calorie malnutrition, and a tremor with associated frailty. After evaluation it was felt TAVR would be a much preferred treatment to redo sternotomy for conventional AVR.  During the course of the patient's preoperative work up they have been evaluated comprehensively by a multidisciplinary team of specialists coordinated through the Multidisciplinary [facility] Valve Clinic in the [facility].  They have been demonstrated to suffer from symptomatic severe aortic stenosis as noted above. The patient has been counseled extensively as to the relative risks and benefits of all options for the treatment of severe aortic stenosis including long term medical therapy, conventional surgery for aortic valve replacement, and transcatheter aortic valve replacement.  The patient has been independently evaluated by two cardiac surgeons including Dr Tyrone Sage and Dr. Laneta Simmers, and they are felt to be at high risk for conventional surgical aortic valve replacement based upon a predicted  risk of mortality using the Society of Thoracic Surgeons risk calculator of 2.5%. Both surgeons indicated the patient would be a poor candidate for conventional surgery (predicted risk of mortality >15% and/or predicted risk of permanent morbidity >50%) because of comorbidities including neurologic problems, weight loss, frailty.   Based upon review of all of the patient's preoperative diagnostic tests they are felt to be candidate for transcatheter aortic valve replacement using the transfemoral approach as an alternative to high risk conventional surgery.    Following the decision to proceed with transcatheter aortic valve replacement, a discussion has been held regarding what types of management strategies would be attempted intraoperatively in the event of life-threatening complications, including whether or not the patient would be considered a candidate for the use of cardiopulmonary bypass and/or conversion to open sternotomy for attempted surgical intervention.  The patient has been advised of a variety of complications that might develop peculiar to this approach including but not limited to risks of death, stroke, paravalvular leak, aortic dissection or other major vascular complications, aortic annulus rupture, device embolization, cardiac rupture or perforation, acute myocardial infarction, arrhythmia, heart block or bradycardia requiring permanent pacemaker placement, congestive heart failure, respiratory failure, renal failure, pneumonia, infection, other late complications related to structural valve deterioration or migration, or other complications that might ultimately cause a temporary or permanent loss of functional independence or other long term morbidity.  The patient provides full informed consent for the procedure as described and all questions were answered preoperatively.  DETAILS OF THE OPERATIVE PROCEDURE  PREPARATION:   The patient is brought to the operating room on the above  mentioned date and central monitoring was established by the anesthesia team including placement of Swan-Ganz catheter and radial arterial line. The patient  is placed in the supine position on the operating table.  Intravenous antibiotics are administered. General endotracheal anesthesia is induced uneventfully. A Foley catheter is placed.  Baseline transesophageal echocardiogram was performed. The patient's chest, abdomen, both groins, and both lower extremities are prepared and draped in a sterile manner. A time out procedure is performed.   PERIPHERAL ACCESS:    Using the modified Seldinger technique, femoral arterial and venous access was obtained with placement of 6 Fr sheaths on the left side.  A pigtail diagnostic catheter was passed through the left femoral arterial sheath under fluoroscopic guidance into the aortic root.  A temporary transvenous pacemaker catheter was passed through the left femoral venous sheath under fluoroscopic guidance into the right ventricle.  The pacemaker was tested to ensure stable lead placement and pacemaker capture. Aortic root angiography was performed in order to determine the optimal angiographic angle for valve deployment.   TRANSFEMORAL ACCESS:   A right femoral arterial cutdown was performed by Dr Laneta Simmers. Please see his separate operative note for details. The patient was heparinized systemically and ACT verified > 250 seconds.    A 14 Fr transfemoral E-sheath was introduced into the right femoral artery after progressively dilating over an Amplatz superstiff wire. An AL-2 catheter was used to direct a straight-tip exchange length wire across the native aortic valve into the left ventricle. This was exchanged out for a pigtail catheter and position was confirmed in the LV apex. Simultaneous LV and Ao pressures were recorded.  The pigtail catheter was then exchanged for an Amplatz Extra-stiff wire in the LV apex. At that point, BAV was performed using a 23 mm  valvuloplasty balloon.  Once optimal position was achieved, BAV was done under rapid ventricular pacing at 180 bpm. The patient recovered well hemodynamically.   TRANSCATHETER HEART VALVE DEPLOYMENT:  An Edwards Sapien 3 THV (size 26 mm) was prepared and crimped per manufacturer's guidelines, and the proper orientation of the valve is confirmed on the [employer] delivery system. The valve was advanced through the introducer sheath using normal technique until in an appropriate position in the abdominal aorta beyond the sheath tip. The balloon was then retracted and using the fine-tuning wheel was centered on the valve. The valve was then advanced across the aortic arch using appropriate flexion of the catheter. The valve was carefully positioned across the aortic valve annulus. The Commander catheter was retracted using normal technique. Once final position of the valve has been confirmed by angiographic assessment, the valve is deployed while temporarily holding ventilation and during rapid ventricular pacing to maintain systolic blood pressure < 50 mmHg and pulse pressure < 10 mmHg. The balloon inflation is held for >3 seconds after reaching full deployment volume. Once the balloon has fully deflated the balloon is retracted into the ascending aorta and valve function is assessed using TEE. There is felt to be no paravalvular leak and no central aortic insufficiency.  The patient's hemodynamic recovery following valve deployment is good.  The deployment balloon and guidewire are both removed. Echo demostrated acceptable post-procedural gradients, stable mitral valve function, and no AI.   PROCEDURE COMPLETION:  The sheath was then removed and arteriotomy repaired by Dr Laneta Simmers. Please see his separate report for details. The temporary pacemaker, pigtail catheters and femoral sheaths were removed with manual pressure used for hemostasis.   The patient tolerated the procedure well and is transported to  the surgical intensive care in stable condition. There were no immediate intraoperative complications. All sponge  instrument and needle counts are verified correct at completion of the operation.   No blood products were administered during the operation.  The patient received a total of 60 mL of intravenous contrast during the procedure.  [name] MD 02/05/2015 12:37 PM

## 2015-02-05 NOTE — Anesthesia Procedure Notes (Signed)
Procedure Name: Intubation Date/Time: 02/05/2015 10:59 AM Performed by: Edmonia Caprio Pre-anesthesia Checklist: Patient identified, Emergency Drugs available, Suction available, Patient being monitored and Timeout performed Patient Re-evaluated:Patient Re-evaluated prior to inductionOxygen Delivery Method: Circle system utilized Preoxygenation: Pre-oxygenation with 100% oxygen Intubation Type: IV induction Ventilation: Mask ventilation without difficulty Laryngoscope Size: Miller and 3 Grade View: Grade I Tube type: Subglottic suction tube Tube size: 7.5 mm Airway Equipment and Method: Stylet Placement Confirmation: ETT inserted through vocal cords under direct vision,  positive ETCO2 and breath sounds checked- equal and bilateral Secured at: 24 cm Tube secured with: Tape Dental Injury: Teeth and Oropharynx as per pre-operative assessment

## 2015-02-05 NOTE — H&P (View-Only) (Signed)
Cardiology Office Note Date:  01/29/2015   ID:  Brandon GamerCharles R Albrecht, DOB 15-Nov-1940, MRN [id number]  PCP:  Pcp Not In System  Cardiologist:  Tonny Bollmanooper, Zachary Nole, MD    Chief Complaint  Patient presents with  . Shortness of Breath   History of Present Illness: Brandon Scott is a 74 y.o. male who presents for evaluation of severe aortic stenosis.   The patient has a history of CAD and previous CABG in 2005 by Dr Tyrone SageGerhardt. He recently underwent echo and cardiac studies. An echo from 12/07/2014 showed normal LV function with an LVEF 55-60%, with mild LVH and aortic stenosis with peak and mean gradients of 61 and 30 mmHg, respectively. The calculated AVA is 0.6 square cm. Cardiac catheterization 01/18/2015 demonstrating severe native vessel CAD with total occlusion of the mid-LAD, left circumflex origin, and proximal RCA. The patient's bypass grafts were patent and these include a SVG-right PDA, SVG sequential to OM1 and OM2, and LIMA-LAD. His LVEF was normal and estimated at 60% with normal LVEDP and normal right heart pressures. Invasive hemodynamics demonstrate preserved CO 3.36 L/min, peak and mean transaortic valve gradients of 26 and 25 mmHg, respectively, and a calculated aortic valve area of 0.81 square cm.  Over the past 2-3 years, he has developed progressive fatigue and exercise intolerance. He complains of shortness of breath with activity. Reports occasional orthopnea, but no PND or leg swelling. No chest pain, chest pressure, lightheadedness, or syncope.   He has developed an essential tremor and this has progressed over recent months. Has some problems with secretions/salivation as well. He has been evaluated by neurology and has treatment recently with mysoline. He has not been diagnosed with Parkinson's. Apparently his mother had a similar tremor as she aged.   The patient has lost significant weight over recent years. Since CABG he has lost 70 pounds slowly. Appetite is reported as ok, and  he eats small portions.     Past Medical History  Diagnosis Date  . CAD (coronary artery disease)   . Aortic stenosis   . Hyperlipidemia   . Hypertension   . Left ventricular hypertrophy   . Arthritis     Past Surgical History  Procedure Laterality Date  . Coronary artery bypass graft  2005  . Tee without cardioversion  12/07/14    [facility] [place]  . Cardiac catheterization  01/17/15    [facility] [place]  . Fracture surgery Left   . Nasal sinus surgery      Current Outpatient Prescriptions  Medication Sig Dispense Refill  . aspirin 81 MG tablet Take 81 mg by mouth daily.    . fluticasone (FLONASE) 50 MCG/ACT nasal spray Place 1 spray into both nostrils 2 (two) times daily.    . metoprolol (LOPRESSOR) 50 MG tablet Take 25 mg by mouth 2 (two) times daily.   0  . niacin 250 MG tablet Take 250 mg by mouth at bedtime.    Marland Kitchen. omeprazole (PRILOSEC) 40 MG capsule TK 1 C PO D  0  . primidone (MYSOLINE) 50 MG tablet Take by mouth 2 (two) times daily.     No current facility-administered medications for this visit.    Allergies:   Sulfa antibiotics   Social History:  The patient  reports that he quit smoking about 40 years ago. His smoking use included Cigarettes. He has a 15 pack-year smoking history. He has never used smokeless tobacco. He reports that he does  not drink alcohol.   Family History:  The patient's  family history includes Heart attack in his father and mother. There is no history of Hypertension or Stroke.    ROS:  Please see the history of present illness.  Otherwise, review of systems is positive for unexplained weight loss, decreased appetite, hearing loss, sinus congestion, tremor, leg pain.  All other systems are reviewed and negative.    PHYSICAL EXAM: VS:  BP 130/68 mmHg  Pulse 67  Ht  (1.753 m)  Wt 146 lb (66.225 kg)  BMI 21.55 kg/m2 , BMI Body mass index is 21.55 kg/(m^2). GEN: Well nourished, well  developed, somewhat delayed/deliberate speech, in no acute distress HEENT: normal Neck: no JVD, no masses. Delayed upstrokes with bilateral bruits CV: RRR with late peaking 3/6 systolic murmur at RUSB, A2 is present                Respiratory:  Coarse breath sounds bilaterally, normal work of breathing GI: soft, nontender, nondistended, + BS MS: no deformity or atrophy Ext: no pretibial edema, pedal pulses 2+= bilaterally Skin: warm and dry, no rash Neuro:  Strength and sensation are intact Psych: euthymic mood, full affect  EKG:  EKG is ordered today. The ekg ordered today shows NSR 67 bpm, age-indeterminate septal infarct, nonspecific ST/T abnormality  Recent Labs: No results found for requested labs within last 365 days.   Lipid Panel  No results found for: CHOL, TRIG, HDL, CHOLHDL, VLDL, LDLCALC, LDLDIRECT    Wt Readings from Last 3 Encounters:  01/29/15 146 lb (66.225 kg)  01/29/15 145 lb (65.772 kg)     Cardiac Studies Reviewed: Echo and cath films/reports reviewed  STS Risk Calculator: STS PROM = 2.5%  ASSESSMENT AND PLAN: 74 yo male with Stage D, severe aortic stenosis. He has a hx of severe CAD and previous CABG with patent bypass grafts by recent cath. He has NYHA III symptoms of dyspnea and fatigue. I have personally reviewed his echo and cath images and agree that he has severe aortic stenosis with severe calcification and restriction of his aortic leaflets, a calculated AVA between 0.6 and 0.8 square cm, and a dimensionless index of 0.17. I have reviewed the natural history of aortic stenosis with the patient and his family members who are present today. We have discussed the limitations of medical therapy and the poor prognosis associated with symptomatic aortic stenosis. We have also reviewed potential treatment options, including palliative medical therapy, conventional surgical aortic valve replacement, and transcatheter aortic valve replacement. We discussed  treatment options in the context of this patient's specific comorbid medical conditions. The patient has been seen by Dr Tyrone Sage and well as a cardiac surgeon at his home of [place], [place], both of whom recommended TAVR for treatment of severe aortic stenosis. I agree that TAVR would likely be favorable to conventional surgery considering his patent bypass grafts, progressive functional decline over recent years, and substantial weight loss and associated physical frailty.   I have reviewed plans for CTA studies of the heart, chest/abd/pelvis and rationale for both as they pertain to TAVR surgical planning. The patient will see Dr Laneta Simmers tomorrow after these studies are completed.   TAVR surgery was reviewed in detail with the patient, his wife, and his son who is an interventional radiologist.  The patient has been advised of a variety of complications that might develop including but not limited to risks of death, stroke, paravalvular leak, aortic dissection or other major vascular complications,  aortic annulus rupture, device embolization, cardiac rupture or perforation, mitral regurgitation, acute myocardial infarction, arrhythmia, heart block or bradycardia requiring permanent pacemaker placement, congestive heart failure, respiratory failure, renal failure, pneumonia, infection, other late complications related to structural valve deterioration or migration, or other complications that might ultimately cause a temporary or permanent loss of functional independence or other long term morbidity.  The patient agrees to proceed with TAVR pending the results of upcoming testing to confirm his suitability for the procedure.   Current medicines are reviewed with the patient today.  The patient does not have concerns regarding medicines.  Labs/ tests ordered today include:  No orders of the defined types were placed in this encounter.    Enzo Bi, MD  01/29/2015 4:30 PM    [facility] [address] [place], [place], [place]  [address] Phone: [phone]; Fax: [phone]

## 2015-02-05 NOTE — Interval H&P Note (Signed)
History and Physical Interval Note:  02/05/2015 10:33 AM  Brandon Scott  has presented today for surgery, with the diagnosis of aortic stenosis  The various methods of treatment have been discussed with the patient and family. After consideration of risks, benefits and other options for treatment, the patient has consented to  Procedure(s): TRANSCATHETER AORTIC VALVE REPLACEMENT, TRANSFEMORAL (N/A) TRANSESOPHAGEAL ECHOCARDIOGRAM (TEE) (N/A) as a surgical intervention .  The patient's history has been reviewed, patient examined, no change in status, stable for surgery.  I have reviewed the patient's chart and labs.  Questions were answered to the patient's satisfaction.   CT's reviewed with TAVR team. Plans for TAVR with 26 mm THV via RFA approach noted.   [name]

## 2015-02-05 NOTE — Plan of Care (Signed)
Problem: Phase II - Intermediate Post-Op Goal: Maintain Hemodynamic Stability Outcome: Completed/Met Date Met:  02/05/15 No vasoactive drips.

## 2015-02-05 NOTE — Progress Notes (Signed)
TCTS BRIEF SICU PROGRESS NOTE  Day of Surgery  S/P Procedure(s) (LRB): TRANSCATHETER AORTIC VALVE REPLACEMENT, TRANSFEMORAL (N/A) TRANSESOPHAGEAL ECHOCARDIOGRAM (TEE) (N/A)   Looks and feels well but went into Afib w/ increased HR BP stable Breathing comfortably UOP adequate Labs okay  Plan: Will start IV amiodarone  Brandon Scott 02/05/2015 8:17 PM

## 2015-02-06 ENCOUNTER — Inpatient Hospital Stay (HOSPITAL_COMMUNITY): Payer: Medicare Other

## 2015-02-06 ENCOUNTER — Other Ambulatory Visit: Payer: Self-pay

## 2015-02-06 ENCOUNTER — Encounter (HOSPITAL_COMMUNITY): Payer: Self-pay | Admitting: Cardiovascular Disease

## 2015-02-06 DIAGNOSIS — Z952 Presence of prosthetic heart valve: Secondary | ICD-10-CM

## 2015-02-06 DIAGNOSIS — I350 Nonrheumatic aortic (valve) stenosis: Secondary | ICD-10-CM

## 2015-02-06 LAB — TYPE AND SCREEN
ABO/RH(D): A POS
Antibody Screen: NEGATIVE
Unit division: 0
Unit division: 0

## 2015-02-06 LAB — CBC
HCT: 37.7 % — ABNORMAL LOW (ref 39.0–52.0)
Hemoglobin: 12.6 g/dL — ABNORMAL LOW (ref 13.0–17.0)
MCH: 30.1 pg (ref 26.0–34.0)
MCHC: 33.4 g/dL (ref 30.0–36.0)
MCV: 90.2 fL (ref 78.0–100.0)
PLATELETS: 146 10*3/uL — AB (ref 150–400)
RBC: 4.18 MIL/uL — ABNORMAL LOW (ref 4.22–5.81)
RDW: 13 % (ref 11.5–15.5)
WBC: 8.9 10*3/uL (ref 4.0–10.5)

## 2015-02-06 LAB — BASIC METABOLIC PANEL
Anion gap: 8 (ref 5–15)
BUN: 11 mg/dL (ref 6–20)
CO2: 24 mmol/L (ref 22–32)
CREATININE: 0.87 mg/dL (ref 0.61–1.24)
Calcium: 8.3 mg/dL — ABNORMAL LOW (ref 8.9–10.3)
Chloride: 101 mmol/L (ref 101–111)
GFR calc Af Amer: >60 mL/min (ref >60)
GFR calc non Af Amer: >60 mL/min (ref >60)
Glucose, Bld: 120 mg/dL — ABNORMAL HIGH (ref 65–99)
Potassium: 3.8 mmol/L (ref 3.5–5.1)
Sodium: 133 mmol/L — ABNORMAL LOW (ref 135–145)

## 2015-02-06 LAB — GLUCOSE, CAPILLARY
GLUCOSE-CAPILLARY: 103 mg/dL — AB (ref 65–99)
GLUCOSE-CAPILLARY: 113 mg/dL — AB (ref 65–99)
Glucose-Capillary: 107 mg/dL — ABNORMAL HIGH (ref 65–99)
Glucose-Capillary: 120 mg/dL — ABNORMAL HIGH (ref 65–99)
Glucose-Capillary: 139 mg/dL — ABNORMAL HIGH (ref 65–99)
Glucose-Capillary: 146 mg/dL — ABNORMAL HIGH (ref 65–99)

## 2015-02-06 LAB — MAGNESIUM: Magnesium: 1.9 mg/dL (ref 1.7–2.4)

## 2015-02-06 MED ORDER — BISACODYL 10 MG RE SUPP: 10.0000 mg | Freq: Every day | RECTAL | Status: DC | PRN

## 2015-02-06 MED ORDER — ENSURE ENLIVE PO LIQD
237.0000 mL | Freq: Two times a day (BID) | ORAL | Status: DC
  Administered 2015-02-06 – 2015-02-08 (×4): 237 mL via ORAL

## 2015-02-06 MED ORDER — ONDANSETRON HCL 4 MG/2ML IJ SOLN: 4.0000 mg | Freq: Four times a day (QID) | INTRAMUSCULAR | Status: DC | PRN

## 2015-02-06 MED ORDER — METOPROLOL TARTRATE 25 MG PO TABS
25.0000 mg | ORAL_TABLET | Freq: Two times a day (BID) | ORAL | Status: DC
  Administered 2015-02-06 (×2): 25 mg via ORAL
  Filled 2015-02-06 (×5): qty 1

## 2015-02-06 MED ORDER — SODIUM CHLORIDE 0.9 % IJ SOLN: 3.0000 mL | INTRAMUSCULAR | Status: DC | PRN

## 2015-02-06 MED ORDER — SODIUM CHLORIDE 0.9 % IV SOLN: 250.0000 mL | INTRAVENOUS | Status: DC | PRN

## 2015-02-06 MED ORDER — FAMOTIDINE 20 MG PO TABS
20.0000 mg | ORAL_TABLET | Freq: Two times a day (BID) | ORAL | Status: DC
  Administered 2015-02-06 – 2015-02-08 (×5): 20 mg via ORAL
  Filled 2015-02-06 (×7): qty 1

## 2015-02-06 MED ORDER — ACETAMINOPHEN 325 MG PO TABS: 650.0000 mg | ORAL_TABLET | Freq: Four times a day (QID) | ORAL | Status: DC | PRN

## 2015-02-06 MED ORDER — METOPROLOL TARTRATE 12.5 MG HALF TABLET
12.5000 mg | ORAL_TABLET | Freq: Four times a day (QID) | ORAL | Status: AC | PRN
  Administered 2015-02-07: 12.5 mg via ORAL
  Filled 2015-02-06 (×3): qty 1

## 2015-02-06 MED ORDER — MOVING RIGHT ALONG BOOK
Freq: Once | Status: AC
  Administered 2015-02-06: 17:00:00
  Filled 2015-02-06 (×2): qty 1

## 2015-02-06 MED ORDER — TRAMADOL HCL 50 MG PO TABS: 50.0000 mg | ORAL_TABLET | ORAL | Status: DC | PRN

## 2015-02-06 MED ORDER — FLUTICASONE PROPIONATE 50 MCG/ACT NA SUSP
1.0000 | Freq: Two times a day (BID) | NASAL | Status: DC
  Administered 2015-02-06 – 2015-02-08 (×5): 1 via NASAL
  Filled 2015-02-06 (×2): qty 16

## 2015-02-06 MED ORDER — BISACODYL 5 MG PO TBEC: 10.0000 mg | DELAYED_RELEASE_TABLET | Freq: Every day | ORAL | Status: DC | PRN

## 2015-02-06 MED ORDER — ENOXAPARIN SODIUM 40 MG/0.4ML ~~LOC~~ SOLN
40.0000 mg | SUBCUTANEOUS | Status: DC
  Administered 2015-02-06 – 2015-02-08 (×3): 40 mg via SUBCUTANEOUS
  Filled 2015-02-06 (×3): qty 0.4

## 2015-02-06 MED ORDER — DOCUSATE SODIUM 100 MG PO CAPS
200.0000 mg | ORAL_CAPSULE | Freq: Every day | ORAL | Status: DC
  Administered 2015-02-06 – 2015-02-08 (×3): 200 mg via ORAL
  Filled 2015-02-06 (×4): qty 2

## 2015-02-06 MED ORDER — SODIUM CHLORIDE 0.9 % IJ SOLN
3.0000 mL | Freq: Two times a day (BID) | INTRAMUSCULAR | Status: DC
  Administered 2015-02-06 – 2015-02-08 (×5): 3 mL via INTRAVENOUS

## 2015-02-06 MED ORDER — ONDANSETRON HCL 4 MG PO TABS: 4.0000 mg | ORAL_TABLET | Freq: Four times a day (QID) | ORAL | Status: DC | PRN

## 2015-02-06 MED FILL — Phenylephrine HCl Inj 10 MG/ML: INTRAMUSCULAR | Qty: 2 | Status: AC

## 2015-02-06 MED FILL — Insulin Regular (Human) Inj 100 Unit/ML: INTRAMUSCULAR | Qty: 2.5 | Status: AC

## 2015-02-06 MED FILL — Heparin Sodium (Porcine) Inj 1000 Unit/ML: INTRAMUSCULAR | Qty: 30 | Status: AC

## 2015-02-06 MED FILL — Dextrose Inj 5%: INTRAVENOUS | Qty: 250 | Status: AC

## 2015-02-06 MED FILL — Magnesium Sulfate Inj 50%: INTRAMUSCULAR | Qty: 10 | Status: AC

## 2015-02-06 MED FILL — Potassium Chloride Inj 2 mEq/ML: INTRAVENOUS | Qty: 40 | Status: AC

## 2015-02-06 NOTE — Progress Notes (Signed)
Utilization Review Completed.  

## 2015-02-06 NOTE — Progress Notes (Signed)
    Subjective:  Feels ok this am. AF with RVR last night, converted to sinus on amio drip. Feels a 'little tired' this am, but no other complaints. No CP or SOB.  Objective:  Vital Signs in the last 24 hours: Temp:  [95.7 F (35.4 C)-98.6 F (37 C)] 97.7 F (36.5 C) (07/27 0812) Pulse Rate:  [50-111] 65 (07/27 0700) Resp:  [8-24] 13 (07/27 0700) BP: (96-137)/(39-87) 120/44 mmHg (07/27 0700) SpO2:  [96 %-100 %] 98 % (07/27 0700) Arterial Line BP: (100-155)/(34-62) 125/39 mmHg (07/27 0700) Weight:  [147 lb (66.679 kg)] 147 lb (66.679 kg) (07/26 1400)  Intake/Output from previous day: 07/26 0701 - 07/27 0700 In: 3869.1 [P.O.:100; I.V.:2969.1; IV Piggyback:800] Out: 2348 [Urine:2298; Blood:50]  Physical Exam: Pt is alert and oriented, elderly male in NAD HEENT: normal Neck: JVP - normal Lungs: CTA bilaterally CV: RRR with 2/6 SEM at the RUSB Abd: soft, NT, Positive BS, no hepatomegaly Ext: no C/C/E, distal pulses intact and equal Skin: warm/dry no rash   Lab Results:  Recent Labs  02/05/15 1330 02/05/15 1337 02/06/15 0415  WBC 7.7  --  8.9  HGB 11.2* 10.9* 12.6*  PLT 130*  --  146*    Recent Labs  02/05/15 1337 02/06/15 0415  NA 137 133*  K 4.0 3.8  CL  --  101  CO2  --  24  GLUCOSE 118* 120*  BUN  --  11  CREATININE  --  0.87   No results for input(s): TROPONINI in the last 72 hours.  Invalid input(s): CK, MB  Tele: AF with RVR converted to sinus  Assessment/Plan:  1. Aortic valve disease s/p TAVR, POD #1 - doing well with plans to tx to SDU today 2. Post-op AF with RVR now in sinus. Agree with transition off amio to beta-blocker considering lung disease. Resume amio only if recurrent AF  Dispo: plans to mobilize, dc lines, transfer to 2W today. Continue tele to watch heart rhythm. Overall doing well. 2D echo today.    [name], M.D. 02/06/2015, 8:31 AM

## 2015-02-06 NOTE — Progress Notes (Signed)
Pt is converting between NSR and A-fib with RVR with HR up to 150s non sustaining. Pt is asymptomatic. No complaints of pain. Paged cardiology practice, awaiting call back. Will continue to monitor closely.   Mao Lockner, RN

## 2015-02-06 NOTE — Progress Notes (Signed)
1 Day Post-Op Procedure(s) (LRB): TRANSCATHETER AORTIC VALVE REPLACEMENT, TRANSFEMORAL (N/A) TRANSESOPHAGEAL ECHOCARDIOGRAM (TEE) (N/A) Subjective:  Did not sleep much but feels ok  Went into atrial fib last pm with HR 130's and started on amio. Converted about midnight and no further afib.  Objective: Vital signs in last 24 hours: Temp:  [95.7 F (35.4 C)-98.6 F (37 C)] 98.6 F (37 C) (07/27 0356) Pulse Rate:  [50-111] 65 (07/27 0700) Cardiac Rhythm:  [-] Normal sinus rhythm (07/27 0029) Resp:  [8-24] 13 (07/27 0700) BP: (96-137)/(39-87) 120/44 mmHg (07/27 0700) SpO2:  [96 %-100 %] 98 % (07/27 0700) Arterial Line BP: (100-155)/(34-62) 125/39 mmHg (07/27 0700) Weight:  [66.679 kg (147 lb)] 66.679 kg (147 lb) (07/26 1400)  Hemodynamic parameters for last 24 hours: PAP: (19-27)/(4-7) 27/5 mmHg CO:  [6.5 L/min] 6.5 L/min CI:  [3.5 L/min/m2] 3.5 L/min/m2  Intake/Output from previous day: 07/26 0701 - 07/27 0700 In: 3869.1 [P.O.:100; I.V.:2969.1; IV Piggyback:800] Out: 2348 [Urine:2298; Blood:50] Intake/Output this shift:    General appearance: alert and cooperative Neurologic: intact Heart: regular rate and rhythm, S1, S2 normal, no murmur, click, rub or gallop Lungs: clear to auscultation bilaterally Extremities: extremities normal, atraumatic, no cyanosis or edema Wound: right groin incision ok  Lab Results:  Recent Labs  02/05/15 1330 02/05/15 1337 02/06/15 0415  WBC 7.7  --  8.9  HGB 11.2* 10.9* 12.6*  HCT 33.7* 32.0* 37.7*  PLT 130*  --  146*   BMET:  Recent Labs  02/05/15 1337 02/06/15 0415  NA 137 133*  K 4.0 3.8  CL  --  101  CO2  --  24  GLUCOSE 118* 120*  BUN  --  11  CREATININE  --  0.87  CALCIUM  --  8.3*    PT/INR:  Recent Labs  02/05/15 1330  LABPROT 17.1*  INR 1.38   ABG    Component Value Date/Time   PHART 7.376 02/05/2015 1342   HCO3 24.5* 02/05/2015 1342   TCO2 26 02/05/2015 1342   ACIDBASEDEF 1.0 02/05/2015 1342   O2SAT  99.0 02/05/2015 1342   CBG (last 3)   Recent Labs  02/05/15 2046 02/06/15 0007 02/06/15 0413  GLUCAP 124* 146* 120*   CXR: stable  Assessment/Plan: S/P Procedure(s) (LRB): TRANSCATHETER AORTIC VALVE REPLACEMENT, TRANSFEMORAL (N/A) TRANSESOPHAGEAL ECHOCARDIOGRAM (TEE) (N/A)  He is hemodynamically stable in sinus rhythm this am. He had postop atrial fib last pm converted with amio. He has significant chronic lung disease on CT scan and I am a little concerned about amio.  HR is 61 this am. Will DC amio and resume his lopressor and observe.  DC sleeve, a-line and foley Transfer to 2W Echo today.  Mobilize.   LOS: 1 day    Alleen Borne 02/06/2015

## 2015-02-06 NOTE — Progress Notes (Signed)
CARDIAC REHAB PHASE I   PRE:  Rate/Rhythm: 61 SR  BP:  Sitting: 134/58        SaO2: 96 RA  MODE:  Ambulation: 550 ft   POST:  Rate/Rhythm: 70 SR  BP:  Sitting: 138/68         SaO2: 95 RA  Pt states he is tired, not wanting to walk, ultimately agreeable with some encouragement. Pt ambulated 550 ft on RA, hand held assist, mildly unsteady gait at times, tolerated well. Pt denies CP, dizziness, DOE, declined rest stop. Pt states he has a "nervous tremor" at baseline and he notices it more after walking, but otherwise has no complaints. VSS. Encouraged pt to ambulate again today and encouraged IS. Pt verbalized understanding. Pt to bed per request after walk, call bell within reach. Will follow-up tomorrow.   [id number]  Joylene Grapes, RN, BSN 02/06/2015 1:33 PM

## 2015-02-06 NOTE — Progress Notes (Signed)
Call back from MD Ambrosy. New orders placed. Will continue to monitor pt closely.   Endi Lagman, RN

## 2015-02-07 ENCOUNTER — Inpatient Hospital Stay (HOSPITAL_COMMUNITY): Payer: Medicare Other

## 2015-02-07 ENCOUNTER — Other Ambulatory Visit (HOSPITAL_COMMUNITY): Payer: Medicare Other

## 2015-02-07 DIAGNOSIS — I4891 Unspecified atrial fibrillation: Secondary | ICD-10-CM

## 2015-02-07 DIAGNOSIS — I359 Nonrheumatic aortic valve disorder, unspecified: Secondary | ICD-10-CM

## 2015-02-07 DIAGNOSIS — I350 Nonrheumatic aortic (valve) stenosis: Secondary | ICD-10-CM

## 2015-02-07 DIAGNOSIS — I9789 Other postprocedural complications and disorders of the circulatory system, not elsewhere classified: Secondary | ICD-10-CM

## 2015-02-07 LAB — GLUCOSE, CAPILLARY
GLUCOSE-CAPILLARY: 113 mg/dL — AB (ref 65–99)
GLUCOSE-CAPILLARY: 120 mg/dL — AB (ref 65–99)

## 2015-02-07 MED ORDER — METOPROLOL TARTRATE 25 MG PO TABS
37.5000 mg | ORAL_TABLET | Freq: Two times a day (BID) | ORAL | Status: DC
  Administered 2015-02-07 – 2015-02-08 (×3): 37.5 mg via ORAL
  Filled 2015-02-07 (×4): qty 1

## 2015-02-07 MED ORDER — AMIODARONE HCL 200 MG PO TABS
400.0000 mg | ORAL_TABLET | Freq: Two times a day (BID) | ORAL | Status: DC
  Administered 2015-02-07 – 2015-02-08 (×3): 400 mg via ORAL
  Filled 2015-02-07 (×4): qty 2

## 2015-02-07 NOTE — Clinical Documentation Improvement (Signed)
Possible Clinical Conditions? (please answer in progress notes and discharge summary).   Severe Protein Calorie Malnutrition Moderate Malnutrition Mild Malnutrition Other Condition Cannot clinically determine  Supporting Information: Per Op Note: "He has progressive weight loss, protein-calorie malnutrition, and a tremor with associated frailty" Risk Factors: Per H&P: "The patient has lost significant weight over recent years" and "unexplained weight loss, decreased appetite"   Thank You,  Alesia Richards, RN CDS [facility] Health Information Management [email or web address] [phone]

## 2015-02-07 NOTE — Progress Notes (Signed)
    Subjective:  Feels tired. No CP or shortness of breath. Episodes of atrial fib noted.   Objective:  Vital Signs in the last 24 hours: Temp:  [97.6 F (36.4 C)-98.2 F (36.8 C)] 97.6 F (36.4 C) (07/28 0444) Pulse Rate:  [74-160] 160 (07/28 0840) Resp:  [18] 18 (07/28 0444) BP: (103-161)/(54-76) 120/60 mmHg (07/28 0840) SpO2:  [95 %-97 %] 96 % (07/28 0444) Weight:  [147 lb 11.2 oz (66.996 kg)] 147 lb 11.2 oz (66.996 kg) (07/27 1807)  Intake/Output from previous day: 07/27 0701 - 07/28 0700 In: 496.7 [P.O.:480; I.V.:16.7] Out: 520 [Urine:520]  Physical Exam: Pt is alert and oriented, elderly male in NAD HEENT: normal Neck: JVP - normal Lungs: CTA bilaterally CV: irregularly irregular with 2/6 SEM at the LSB Abd: soft, NT, Positive BS, no hepatomegaly Ext: no C/C/E, distal pulses intact and equal, groin sites clear Skin: warm/dry no rash  Lab Results:  Recent Labs  02/05/15 1330 02/05/15 1337 02/06/15 0415  WBC 7.7  --  8.9  HGB 11.2* 10.9* 12.6*  PLT 130*  --  146*    Recent Labs  02/05/15 1337 02/06/15 0415  NA 137 133*  K 4.0 3.8  CL  --  101  CO2  --  24  GLUCOSE 118* 120*  BUN  --  11  CREATININE  --  0.87   No results for input(s): TROPONINI in the last 72 hours.  Invalid input(s): CK, MB  Cardiac Studies: 2D Echo pending  Tele: Atrial fibrillation HR 80's, but episodes of RVR with rates in 150's  Assessment/Plan:  1. Severe Aortic Stenosis POD #2 from TAVR - await postoperative 2-D echocardiogram today. Overall progressing at an expected pace considering his preoperative baseline.  2. Postoperative atrial fibrillation. He has now had recurrent atrial fibrillation with some elevated ventricular rates. I am going to start him on oral amiodarone and hope to treat him for a short duration considering his lung disease. Depending on whether he continues to have episodic atrial fibrillation, may need to discontinue clopidogrel and start him on  warfarin. We'll give another 24 hours on oral amiodarone to see if he is on doing sinus rhythm. Continue beta blocker.  3. Essential hypertension: Controlled  4. CAD status post CABG: Stable without angina   Brandon Scott, M.D. 02/07/2015, 9:42 AM

## 2015-02-07 NOTE — Progress Notes (Signed)
Pt had brief intermittent episodes of a-flutter with heart rate going up in the 160s, while at rest and then he would convert to sinus rhythm in the 70s to 90s. He had several of these episodes this morning. He is usually asymptomatic. He had some med changes made, increase in metoprolol and started on amiodarone. He has been in sinus rhythm since early this afternoon rate in the 60s to 70s. He has ambulated in the hallways with staff x 3 and tolerated well with heart rate remaining stable. Continue to be monitored by staff.

## 2015-02-07 NOTE — Care Management Note (Signed)
Case Management Note  Patient Details  Name: Brandon Scott MRN: [id number] Date of Birth: [birth year]  Subjective/Objective:                 Patient lives with wife in [place] and is independent.  Had surgery about 10 years ago with TCTS so came up here for TAVR.  Staying with son.  Bedroom upstairs.  Will need to be able to go up stairs.   Action/Plan:   Expected Discharge Date:                  Expected Discharge Plan:  Home/Self Care  In-House Referral:     Discharge planning Services  CM Consult  Post Acute Care Choice:    Choice offered to:     DME Arranged:    DME Agency:     HH Arranged:    HH Agency:     Status of Service:  In process, will continue to follow  Medicare Important Message Given:    Date Medicare IM Given:    Medicare IM give by:    Date Additional Medicare IM Given:    Additional Medicare Important Message give by:     If discussed at Long Length of Stay Meetings, dates discussed:    Additional Comments:  Vangie Bicker, RN 02/07/2015, 3:23 PM

## 2015-02-07 NOTE — Progress Notes (Signed)
  Echocardiogram 2D Echocardiogram has been performed.  Leta Jungling M 02/07/2015, 1:51 PM

## 2015-02-07 NOTE — Progress Notes (Signed)
CARDIAC REHAB PHASE I   PRE:  Rate/Rhythm: 61 SR  BP:  Sitting: 92/48        SaO2: 99 RA  MODE:  Ambulation: 550 ft   POST:  Rate/Rhythm: 64 SR  BP:  Sitting: 119/56         SaO2: 97 RA  Pt sitting up in chair on oxygen. Pt states "I had a bad night last night, I'm not sure if I'm supposed to walk."  Per RN, pt ok to ambulate. RN states she is not sure why pt has been on oxygen. Pt sats 97% on RA at rest. Pt BP lower today, pt asymptomatic.  Pt ambulated 550 ft on RA, gait belt, hand held assist, mildly unsteady gait at baseline, tolerated well. Pt denies CP, dizziness, DOE, declined rest stop. VSS. HR remained 70s SR during ambulation. Pt to recliner after walk, chair alarm on, call bell within reach. Encouraged ambulation x2 more today and IS. Pt verbalized understanding. Will follow-up tomorrow.   [phone]  Joylene Grapes, RN, BSN 02/07/2015 2:10 PM

## 2015-02-07 NOTE — Progress Notes (Addendum)
      [address]       [place] [address]             [phone]        2 Days Post-Op Procedure(s) (LRB): TRANSCATHETER AORTIC VALVE REPLACEMENT, TRANSFEMORAL (N/A) TRANSESOPHAGEAL ECHOCARDIOGRAM (TEE) (N/A)  Subjective: Patient sitting up at edge of bed  Objective: Vital signs in last 24 hours: Temp:  [97.6 F (36.4 C)-98.2 F (36.8 C)] 97.6 F (36.4 C) (07/28 0444) Pulse Rate:  [70-124] 100 (07/28 0444) Cardiac Rhythm:  [-] Normal sinus rhythm;Atrial fibrillation (07/27 1940) Resp:  [14-18] 18 (07/28 0444) BP: (103-161)/(53-76) 103/55 mmHg (07/28 0444) SpO2:  [95 %-97 %] 96 % (07/28 0444) Weight:  [147 lb 11.2 oz (66.996 kg)] 147 lb 11.2 oz (66.996 kg) (07/27 1807)   Current Weight  02/06/15 147 lb 11.2 oz (66.996 kg)      Intake/Output from previous day: 07/27 0701 - 07/28 0700 In: 496.7 [P.O.:480; I.V.:16.7] Out: 520 [Urine:520]   Physical Exam:  Cardiovascular: RRR, no murmur Pulmonary: Clear to auscultation bilaterally; no rales, wheezes, or rhonchi. Abdomen: Soft, non tender, bowel sounds present. Extremities: SCDs in place. DP palpable bilaterally Wounds: Right groin wound is clean and dry. No erythema or signs of infection.  Lab Results: CBC: Recent Labs  02/05/15 1330 02/05/15 1337 02/06/15 0415  WBC 7.7  --  8.9  HGB 11.2* 10.9* 12.6*  HCT 33.7* 32.0* 37.7*  PLT 130*  --  146*   BMET:  Recent Labs  02/05/15 1337 02/06/15 0415  NA 137 133*  K 4.0 3.8  CL  --  101  CO2  --  24  GLUCOSE 118* 120*  BUN  --  11  CREATININE  --  0.87  CALCIUM  --  8.3*    PT/INR:  Lab Results  Component Value Date   INR 1.38 02/05/2015   INR 1.01 02/01/2015   ABG:  INR: Will add last result for INR, ABG once components are confirmed Will add last 4 CBG results once components are confirmed  Assessment/Plan:  1. CV - A fib with CVR (HR in the 90's). Was on Amiodarone previously. He has a history of lung disease so  Amiodarone stopped. On Lopressor 25 mg bid and Plavix 75 mg daily. Management per cardiology 2.  Pulmonary - On room air. Encourage incentive spirometer 3.  Acute blood loss anemia - H and H yesterday stable at 12.6 and 37.7 4. Management per cardiology  ZIMMERMAN,DONIELLE MPA-C 02/07/2015,8:08 AM   Chart reviewed, patient examined, agree with above. He looks good overall. Recurrent atrial fib so started back on amio.

## 2015-02-08 DIAGNOSIS — I350 Nonrheumatic aortic (valve) stenosis: Secondary | ICD-10-CM

## 2015-02-08 MED ORDER — CLOPIDOGREL BISULFATE 75 MG PO TABS: 75.0000 mg | ORAL_TABLET | Freq: Every day | ORAL | Status: DC

## 2015-02-08 MED ORDER — AMIODARONE HCL 400 MG PO TABS: 400.0000 mg | ORAL_TABLET | Freq: Two times a day (BID) | ORAL | Status: DC

## 2015-02-08 MED ORDER — METOPROLOL TARTRATE 37.5 MG PO TABS: 37.5000 mg | ORAL_TABLET | Freq: Two times a day (BID) | ORAL | Status: DC

## 2015-02-08 NOTE — Progress Notes (Signed)
CARDIAC REHAB PHASE I   Pt sitting up in chair with wife at bedside, ready for discharge. Pt states he has been ambulating without difficulty. Cardiac surgery discharge education completed with pt wife at bedside. Reviewed IS, incision care, activity progression, exercise, risk factors, heart healthy diet and phase 2 cardiac rehab. Pt and wife verbalized understanding. Pt declines phase 2 cardiac rehab referral at this time, pt lives in [place] where he will be returning next week. Pt states he did complete phase 2 cardiac rehab after his CABG surgery and will f/u with his local cardiologist for a referral to phase 2 cardiac rehab again. Pt and family state they have no further questions at this time. Pt up in recliner, wife and son at bedside, call bell within reach.   [phone]   Joylene Grapes, RN, BSN 02/08/2015 11:42 AM

## 2015-02-08 NOTE — Discharge Summary (Signed)
Physician Discharge Summary     Cardiologist: Theadora Rama Patient ID: Brandon Scott MRN: [id number] DOB/AGE: 76-Oct-1942 76 y.o.  Admit date: 02/05/2015 Discharge date: 02/08/2015  Admission Diagnoses:  Severe aortic stenosis  Discharge Diagnoses:  Active Problems:   Severe aortic valve stenosis   paroxysmal atrial fibrillation   Hypertension    Coronary artery disease   Coronary artery bypass grafting   Discharged Condition: stable  Hospital Course:  Brandon Scott is a 74 y.o. male who presented for evaluation of severe aortic stenosis.  The patient has a history of CAD and previous CABG in 2005 by Dr Tyrone Sage. He recently underwent echo and cardiac studies. An echo from 12/07/2014 showed normal LV function with an LVEF 55-60%, with mild LVH and aortic stenosis with peak and mean gradients of 61 and 30 mmHg, respectively. The calculated AVA is 0.6 square cm. Cardiac catheterization 01/18/2015 demonstrating severe native vessel CAD with total occlusion of the mid-LAD, left circumflex origin, and proximal RCA. The patient's bypass grafts were patent and these include a SVG-right PDA, SVG sequential to OM1 and OM2, and LIMA-LAD. His LVEF was normal and estimated at 60% with normal LVEDP and normal right heart pressures. Invasive hemodynamics demonstrate preserved CO 3.36 L/min, peak and mean transaortic valve gradients of 26 and 25 mmHg, respectively, and a calculated aortic valve area of 0.81 square cm.  Over the past 2-3 years, he has developed progressive fatigue and exercise intolerance. He complains of shortness of breath with activity. Reports occasional orthopnea, but no PND or leg swelling. No chest pain, chest pressure, lightheadedness, or syncope.   He has developed an essential tremor and this has progressed over recent months. Has some problems with secretions/salivation as well. He has been evaluated by neurology and has treatment recently with mysoline. He has not been  diagnosed with Parkinson's. Apparently his mother had a similar tremor as she aged.   The patient has lost significant weight over recent years. Since CABG he has lost 70 pounds slowly. Appetite is reported as ok, and he eats small portions.   Patient presented on 02/05/2015 for transcatheter aortic valve replacement. Procedure was completed successfully. He did have postop atrial fibrillation and was started on IV amiodarone which was later discontinued after converting to normal sinus rhythm. However, he went back in atrial fibrillation and was restarted on amiodarone by mouth. Metoprolol was also increased. Patient ambulated 550 feet postop day 1 with cardiac rehabilitation.  He tolerated well.  Postop echocardiogram revealed ejection fraction of 65-70%, well-seated bioprosthetic aortic valve with minimal gradient and no significant regurgitation.  Patient was seen by Dr. Excell Seltzer who felt he was stable for discharge home. Patient be continued on amiodarone 400 mg twice daily for 2 weeks then 200 mg daily.  Expectation is that we can stop this after about 6 weeks of treatment. He is on aspirin and Plavix.     Edwards Sapien 3 THV (size 26 mm, model # 9600TFX, serial # [id number])  Consults: Cardiac rehabilitation  Significant Diagnostic Studies:  Study Conclusions  - Left ventricle: The cavity size was normal. Wall thickness was increased in a pattern of mild LVH. Systolic function was vigorous. The estimated ejection fraction was in the range of 65% to 70%. Indeterminant diastolic function (atrial fibrillation). Wall motion was normal; there were no regional wall motion abnormalities. - Aortic valve: S/p bioprosthetic aortic valve replacement (TAVR). The valve was well-seated with minimal gradient. There was no significant regurgitation. Mean gradient (S): 3  mm Hg. - Mitral valve: Moderately calcified annulus. Moderately calcified leaflets . There was trivial regurgitation. -  Left atrium: The atrium was mildly dilated. - Right ventricle: Probably normal size and systolic function. - Right atrium: Poorly visualized. - Pulmonary arteries: No complete TR doppler jet so unable to estimate PA systolic pressure. - Inferior vena cava: The vessel was normal in size. The respirophasic diameter changes were in the normal range (= 50%), consistent with normal central venous pressure.  Impressions:  - Normal LV size with mild LV hypertrophy. EF 65-70%. Poorly visualized RV but probably normal. s/p TAVR with well-seated bioprosthetic aortic valve with minimal gradient and no significant regurgitation.  Treatments: See above    Discharge Exam: Blood pressure 128/57, pulse 60, temperature 98.7 F (37.1 C), temperature source Oral, resp. rate 17, height  (1.753 m), weight 142 lb 11.2 oz (64.728 kg), SpO2 100 %.   Disposition:       Discharge Instructions    Diet - low sodium heart healthy    Complete by:  As directed      Increase activity slowly    Complete by:  As directed             Medication List    TAKE these medications        amiodarone 400 MG tablet  Commonly known as:  PACERONE  Take 1 tablet (400 mg total) by mouth 2 (two) times daily.     aspirin 81 MG tablet  Take 81 mg by mouth daily.     clopidogrel 75 MG tablet  Commonly known as:  PLAVIX  Take 1 tablet (75 mg total) by mouth daily with breakfast.     fluticasone 50 MCG/ACT nasal spray  Commonly known as:  FLONASE  Place 1 spray into both nostrils 2 (two) times daily.     ibuprofen 200 MG tablet  Commonly known as:  ADVIL,MOTRIN  Take 400 mg by mouth every 8 (eight) hours as needed for mild pain or moderate pain.     Metoprolol Tartrate 37.5 MG Tabs  Take 37.5 mg by mouth 2 (two) times daily.     niacin 500 MG tablet  Take 1,000 mg by mouth 2 (two) times daily with a meal.     omeprazole 40 MG capsule  Commonly known as:  PRILOSEC  Take 1 capsule daily       primidone 50 MG tablet  Commonly known as:  MYSOLINE  Take 50 mg by mouth 2 (two) times daily.       Follow-up Information    Follow up with Alleen Borne, MD On 02/13/2015.   Specialty:  Cardiothoracic Surgery   Why:  12:30 PM   Contact information:   [address] [place] [address] [phone]       Follow up with [name], MD On 03/11/2015.   Specialty:  Cardiology   Why:  11:30 AM   Contact information:   [address] [place] [address] [phone]       Follow up with Echocardiogram On 03/11/2015.   Why:  10:30 AM   Contact information:   [address] [place] [address] [phone]     Greater than 30 minutes was spent completing the patient's discharge.    SignedWilburt Finlay, PAC 02/08/2015, 12:16 PM

## 2015-02-08 NOTE — Progress Notes (Signed)
Utilization Review completed. Arelia Volpe RN BSN CM 

## 2015-02-08 NOTE — Progress Notes (Addendum)
Subjective:  Feels good today. No CP, dyspnea, or palpitations.   Objective:  Vital Signs in the last 24 hours: Temp:  [97.5 F (36.4 C)-98.8 F (37.1 C)] 98.7 F (37.1 C) (07/29 0517) Pulse Rate:  [60-64] 60 (07/29 0517) Resp:  [17-19] 17 (07/29 0517) BP: (108-128)/(51-57) 128/57 mmHg (07/29 0517) SpO2:  [99 %-100 %] 100 % (07/28 2008) Weight:  [142 lb 11.2 oz (64.728 kg)] 142 lb 11.2 oz (64.728 kg) (07/29 0517)  Intake/Output from previous day: 07/28 0701 - 07/29 0700 In: 1200 [P.O.:1200] Out: 2175 [Urine:2175]  Physical Exam: Pt is alert and oriented, NAD HEENT: normal Neck: JVP - normal Lungs: CTA bilaterally CV: RRR with 2/6 SEM at the RUSB Abd: soft, NT, Positive BS Ext: no C/C/E, distal pulses intact and equal, bilateral groin sites clear Skin: warm/dry no rash  Lab Results:  Recent Labs  02/05/15 1330 02/05/15 1337 02/06/15 0415  WBC 7.7  --  8.9  HGB 11.2* 10.9* 12.6*  PLT 130*  --  146*    Recent Labs  02/05/15 1337 02/06/15 0415  NA 137 133*  K 4.0 3.8  CL  --  101  CO2  --  24  GLUCOSE 118* 120*  BUN  --  11  CREATININE  --  0.87   No results for input(s): TROPONINI in the last 72 hours.  Invalid input(s): CK, MB  Cardiac Studies: 2D echo: Study Conclusions  - Left ventricle: The cavity size was normal. Wall thickness was increased in a pattern of mild LVH. Systolic function was vigorous. The estimated ejection fraction was in the range of 65% to 70%. Indeterminant diastolic function (atrial fibrillation). Wall motion was normal; there were no regional wall motion abnormalities. - Aortic valve: S/p bioprosthetic aortic valve replacement (TAVR). The valve was well-seated with minimal gradient. There was no significant regurgitation. Mean gradient (S): 3 mm Hg. - Mitral valve: Moderately calcified annulus. Moderately calcified leaflets . There was trivial regurgitation. - Left atrium: The atrium was mildly  dilated. - Right ventricle: Probably normal size and systolic function. - Right atrium: Poorly visualized. - Pulmonary arteries: No complete TR doppler jet so unable to estimate PA systolic pressure. - Inferior vena cava: The vessel was normal in size. The respirophasic diameter changes were in the normal range (= 50%), consistent with normal central venous pressure.  Impressions:  - Normal LV size with mild LV hypertrophy. EF 65-70%. Poorly visualized RV but probably normal. s/p TAVR with well-seated bioprosthetic aortic valve with minimal gradient and no significant regurgitation.   Tele: Sinus rhythm since noon yesterday  Assessment/Plan:  1. 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 Home today. Continue ASA/Plavix. FU arranged with Dr Laneta Simmers Wednesday, then will return home to [facility] where he sees Dr Brendia Sacks for cariology care. I have called him and left a message to call back.   2. Post-op atrial fib: maintaining sinus rhythm on amiodarone and metoprolol. Continue Amio 400 mg BID x 2 weeks, then 200 mg daily. Hopefully can limit to about 6 weeks of total treatment.   3. HTN: controlled.   4. 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  5. Moderate protein calorie malnutrition - likely result of generalized physical decline - encouraged to do his best at maintaining adequate caloric intake  Dispo: home this am. Follow-up as above. Appts scheduled for Dr Laneta Simmers and then to see me at 30 days with an echo.   [name], M.D. 02/08/2015, 11:08 AM

## 2015-02-08 NOTE — Progress Notes (Addendum)
      [address]       [place] [address]             [phone]        3 Days Post-Op Procedure(s) (LRB): TRANSCATHETER AORTIC VALVE REPLACEMENT, TRANSFEMORAL (N/A) TRANSESOPHAGEAL ECHOCARDIOGRAM (TEE) (N/A)  Subjective: Patient sitting in chair.  Objective: Vital signs in last 24 hours: Temp:  [97.5 F (36.4 C)-98.8 F (37.1 C)] 98.7 F (37.1 C) (07/29 0517) Pulse Rate:  [60-160] 60 (07/29 0517) Cardiac Rhythm:  [-] Normal sinus rhythm;Atrial fibrillation (07/28 2111) Resp:  [17-19] 17 (07/29 0517) BP: (108-128)/(51-60) 128/57 mmHg (07/29 0517) SpO2:  [99 %-100 %] 100 % (07/28 2008) Weight:  [142 lb 11.2 oz (64.728 kg)] 142 lb 11.2 oz (64.728 kg) (07/29 0517)   Current Weight  02/08/15 142 lb 11.2 oz (64.728 kg)      Intake/Output from previous day: 07/28 0701 - 07/29 0700 In: 1200 [P.O.:1200] Out: 2175 [Urine:2175]   Physical Exam:  Cardiovascular: RRR, no murmur Pulmonary: Clear to auscultation bilaterally; no rales, wheezes, or rhonchi. Abdomen: Soft, non tender, bowel sounds present. Extremities: DP palpable bilaterally Wounds: Right groin wound is clean and dry. No erythema or signs of infection.  Lab Results: CBC:  Recent Labs  02/05/15 1330 02/05/15 1337 02/06/15 0415  WBC 7.7  --  8.9  HGB 11.2* 10.9* 12.6*  HCT 33.7* 32.0* 37.7*  PLT 130*  --  146*   BMET:   Recent Labs  02/05/15 1337 02/06/15 0415  NA 137 133*  K 4.0 3.8  CL  --  101  CO2  --  24  GLUCOSE 118* 120*  BUN  --  11  CREATININE  --  0.87  CALCIUM  --  8.3*    PT/INR:  Lab Results  Component Value Date   INR 1.38 02/05/2015   INR 1.01 02/01/2015   ABG:  INR: Will add last result for INR, ABG once components are confirmed Will add last 4 CBG results once components are confirmed  Assessment/Plan:  1. CV - Paroxysmal a fib. Maintaining SR.  On Amiodarone 400 mg bid, Lopressor 37.5 mg bid, and Plavix 75 mg daily. Management per  cardiology 2.  Pulmonary - On room air. Encourage incentive spirometer 3.  Acute blood loss anemia - Last H and H yesterday at 12.6 and 37.7 4. Management per cardiology  ZIMMERMAN,DONIELLE MPA-C 02/08/2015,8:09 AM    Chart reviewed, patient examined, agree with above. He is doing well and maintaining NSR since yesterday. He is going home and I will see in the office next wed before he goes back to [place], [place].

## 2015-02-08 NOTE — Care Management Important Message (Signed)
Important Message  Patient Details  Name: Brandon Scott MRN: [id number] Date of Birth: 14-Jun-1941   Medicare Important Message Given:  Yes-second notification given    Kyla Balzarine 02/08/2015, 1:56 PMImportant Message  Patient Details  Name: Brandon Scott MRN: [id number] Date of Birth: December 30, 1940   Medicare Important Message Given:  Yes-second notification given    Kyla Balzarine 02/08/2015, 1:56 PM

## 2015-02-08 NOTE — Progress Notes (Signed)
Pt education regarding d/c completed, pt states "Dr. Eather Colas of steps in home and I am fine with no more help". IV's taken out, cardiac monitor d/c and TMU called. Hiram Comber RN

## 2015-02-11 LAB — POCT I-STAT, CHEM 8
BUN: 18 mg/dL (ref 6–20)
BUN: 18 mg/dL (ref 6–20)
BUN: 18 mg/dL (ref 6–20)
CALCIUM ION: 1.19 mmol/L (ref 1.13–1.30)
CALCIUM ION: 1.17 mmol/L (ref 1.13–1.30)
CHLORIDE: 105 mmol/L (ref 101–111)
CHLORIDE: 105 mmol/L (ref 101–111)
CREATININE: 0.8 mg/dL (ref 0.61–1.24)
CREATININE: 0.8 mg/dL (ref 0.61–1.24)
Calcium, Ion: 1.16 mmol/L (ref 1.13–1.30)
Chloride: 105 mmol/L (ref 101–111)
Creatinine, Ser: 0.8 mg/dL (ref 0.61–1.24)
Glucose, Bld: 112 mg/dL — ABNORMAL HIGH (ref 65–99)
Glucose, Bld: 116 mg/dL — ABNORMAL HIGH (ref 65–99)
Glucose, Bld: 125 mg/dL — ABNORMAL HIGH (ref 65–99)
HCT: 34 % — ABNORMAL LOW (ref 39.0–52.0)
HEMATOCRIT: 37 % — AB (ref 39.0–52.0)
HEMATOCRIT: 34 % — AB (ref 39.0–52.0)
Hemoglobin: 12.6 g/dL — ABNORMAL LOW (ref 13.0–17.0)
Hemoglobin: 11.6 g/dL — ABNORMAL LOW (ref 13.0–17.0)
Hemoglobin: 11.6 g/dL — ABNORMAL LOW (ref 13.0–17.0)
POTASSIUM: 3.9 mmol/L (ref 3.5–5.1)
Potassium: 4 mmol/L (ref 3.5–5.1)
Potassium: 4 mmol/L (ref 3.5–5.1)
SODIUM: 138 mmol/L (ref 135–145)
Sodium: 138 mmol/L (ref 135–145)
Sodium: 137 mmol/L (ref 135–145)
TCO2: 24 mmol/L (ref 0–100)
TCO2: 23 mmol/L (ref 0–100)
TCO2: 23 mmol/L (ref 0–100)

## 2015-02-12 ENCOUNTER — Encounter: Payer: Self-pay | Admitting: Surgery

## 2015-02-12 ENCOUNTER — Ambulatory Visit (INDEPENDENT_AMBULATORY_CARE_PROVIDER_SITE_OTHER): Payer: Medicare Other | Admitting: Surgery

## 2015-02-12 VITALS — BP 116/68 | HR 57 | Resp 16 | Ht 69.0 in | Wt 142.0 lb

## 2015-02-12 DIAGNOSIS — I350 Nonrheumatic aortic (valve) stenosis: Secondary | ICD-10-CM | POA: Diagnosis not present

## 2015-02-12 DIAGNOSIS — Z954 Presence of other heart-valve replacement: Secondary | ICD-10-CM

## 2015-02-12 DIAGNOSIS — Z953 Presence of xenogenic heart valve: Secondary | ICD-10-CM

## 2015-02-12 NOTE — Progress Notes (Signed)
HPI: Patient returns for routine postoperative follow-up having undergone right transfemoral TAVR on 02/05/2015. The patient's early postoperative recovery while in the hospital was notable for development of postop atrial fibrillation converted with amiodarone. He did have some brief recurrences but maintained sinus rhythm on amiodarone and lopressor for 24 hrs prior to discharge.  Since hospital discharge the patient reports that he has been feeling well and is walking some without shortness of breath. He still gets tired but that is improving too. He has not noted any fast heart rate.   Current Outpatient Prescriptions  Medication Sig Dispense Refill  . amiodarone (PACERONE) 400 MG tablet Take 1 tablet (400 mg total) by mouth 2 (two) times daily. 45 tablet 1  . aspirin 81 MG tablet Take 81 mg by mouth daily.    . clopidogrel (PLAVIX) 75 MG tablet Take 1 tablet (75 mg total) by mouth daily with breakfast. 30 tablet 11  . fluticasone (FLONASE) 50 MCG/ACT nasal spray Place 1 spray into both nostrils 2 (two) times daily.    . Metoprolol Tartrate 37.5 MG TABS Take 37.5 mg by mouth 2 (two) times daily. 60 tablet 11  . niacin 500 MG tablet Take 1,000 mg by mouth 2 (two) times daily with a meal.    . omeprazole (PRILOSEC) 40 MG capsule Take 1 capsule daily  0  . primidone (MYSOLINE) 50 MG tablet Take 50 mg by mouth 2 (two) times daily.     Marland Kitchen ibuprofen (ADVIL,MOTRIN) 200 MG tablet Take 400 mg by mouth every 8 (eight) hours as needed for mild pain or moderate pain.     No current facility-administered medications for this visit.    Physical Exam: BP 116/68 mmHg  Pulse 57  Resp 16  Ht  (1.753 m)  Wt 142 lb (64.411 kg)  BMI 20.96 kg/m2  SpO2 96% He looks well. Lung exam reveals dry velcro crackles in the right lung base that were present preop. Cardiac exam shows a regular rate and rhythm with normal heart sounds. There is no murmur. The right groin incision is healing well and  there is no peripheral edema. The left groin catheter sites are healed with no hematoma.    Diagnostic Tests:  POD 1 echo                **         *[facility]*            [address]            [place], [place] [address]              [phone]  ------------------------------------------------------------------- Transthoracic Echocardiography  Patient:  Brandon Scott, Brandon Scott MR #:    [id number] Study Date: 02/07/2015 Gender:   M Age:    74 Height:   175.3 cm Weight:   66.8 kg BSA:    1.8 m^2 Pt. Status: Room:    2W32C  ADMITTING  Evelene Croon, MD ATTENDING  Evelene Croon, MD ORDERING   [name] PERFORMING  [facility], Inpatient SONOGRAPHER Leta Jungling, RDCS  cc:  ------------------------------------------------------------------- LV EF: 65% -  70%  ------------------------------------------------------------------- Indications:   Aortic Valve Disorder 424.1/I35.9.  ------------------------------------------------------------------- History:  PMH:  Atrial fibrillation. Coronary artery disease.  ------------------------------------------------------------------- Study Conclusions  - Left ventricle: The cavity size was normal. Wall thickness was increased in a pattern of mild LVH. Systolic function was vigorous. The estimated ejection fraction was in the  range of 65% to 70%. Indeterminant diastolic function (atrial fibrillation). Wall motion was normal; there were no regional wall motion abnormalities. - Aortic valve: S/p bioprosthetic aortic valve replacement (TAVR). The valve was well-seated with minimal gradient. There was no significant regurgitation. Mean gradient (S): 3 mm Hg. - Mitral valve: Moderately calcified annulus. Moderately calcified leaflets . There was trivial regurgitation. - Left atrium: The  atrium was mildly dilated. - Right ventricle: Probably normal size and systolic function. - Right atrium: Poorly visualized. - Pulmonary arteries: No complete TR doppler jet so unable to estimate PA systolic pressure. - Inferior vena cava: The vessel was normal in size. The respirophasic diameter changes were in the normal range (= 50%), consistent with normal central venous pressure.  Impressions:  - Normal LV size with mild LV hypertrophy. EF 65-70%. Poorly visualized RV but probably normal. s/p TAVR with well-seated bioprosthetic aortic valve with minimal gradient and no significant regurgitation.  ------------------------------------------------------------------- Labs, prior tests, procedures, and surgery: (current admission).   Aortic valve replacement with a bioprosthetic valve. Transthoracic echocardiography. M-mode, complete 2D, spectral Doppler, and color Doppler. Birthdate: Patient birthdate: 01-19-1941. Age: Patient is 74 yr old. Sex: Gender: male. BMI: 21.8 kg/m^2. Blood pressure:   103/55 Patient status: Inpatient. Study date: Study date: 02/07/2015. Study time: 11:57 AM. Location: Bedside.  -------------------------------------------------------------------  ------------------------------------------------------------------- Left ventricle: The cavity size was normal. Wall thickness was increased in a pattern of mild LVH. Systolic function was vigorous. The estimated ejection fraction was in the range of 65% to 70%. Indeterminant diastolic function (atrial fibrillation). Wall motion was normal; there were no regional wall motion abnormalities.  ------------------------------------------------------------------- Aortic valve: S/p bioprosthetic aortic valve replacement (TAVR). The valve was well-seated with minimal gradient. Doppler: There was no significant regurgitation.  VTI ratio of LVOT to aortic valve: 0.86. Indexed valve  area (VTI): 1.65 cm^2/m^2. Peak velocity ratio of LVOT to aortic valve: 0.79. Indexed valve area (Vmax): 1.52 cm^2/m^2. Mean velocity ratio of LVOT to aortic valve: 0.82. Indexed valve area (Vmean): 1.57 cm^2/m^2.  Mean gradient (S): 3 mm Hg. Peak gradient (S): 7 mm Hg.  ------------------------------------------------------------------- Aorta: Aortic root: The aortic root was normal in size. Ascending aorta: The ascending aorta was normal in size.  ------------------------------------------------------------------- Mitral valve:  Moderately calcified annulus. Moderately calcified leaflets . Doppler:  There was no evidence for stenosis.  There was trivial regurgitation.  Peak gradient (D): 4 mm Hg.  ------------------------------------------------------------------- Left atrium: The atrium was mildly dilated.  ------------------------------------------------------------------- Right ventricle: Poorly visualized. Probably normal size and systolic function.  ------------------------------------------------------------------- Pulmonic valve:  Structurally normal valve.  Cusp separation was normal. Doppler: Transvalvular velocity was within the normal range. There was trivial regurgitation.  ------------------------------------------------------------------- Tricuspid valve:  Doppler: There was no significant regurgitation.  ------------------------------------------------------------------- Pulmonary artery:  No complete TR doppler jet so unable to estimate PA systolic pressure.  ------------------------------------------------------------------- Right atrium: Poorly visualized.  ------------------------------------------------------------------- Pericardium: There was no pericardial effusion.  ------------------------------------------------------------------- Systemic veins: Inferior vena cava: The vessel was normal in size. The respirophasic diameter  changes were in the normal range (= 50%), consistent with normal central venous pressure.  ------------------------------------------------------------------- Measurements  Left ventricle              Value     Reference LV ID, ED, PLAX chordal      (L)   26.5 mm    43 - 52 LV ID, ES, PLAX chordal      (L)   18.8 mm    23 - 38 LV fx  shortening, PLAX chordal      29  %    >=29 LV PW thickness, ED            11.5 mm    --------- IVS/LV PW ratio, ED            1.04      <=1.3 Stroke volume, 2D             64  ml    --------- Stroke volume/bsa, 2D           36  ml/m^2  ---------  Ventricular septum            Value     Reference IVS thickness, ED             12  mm    ---------  LVOT                   Value     Reference LVOT ID, S                21  mm    --------- LVOT area                 3.46 cm^2   --------- LVOT peak velocity, S           103  cm/s   --------- LVOT mean velocity, S           68.6 cm/s   --------- LVOT VTI, S                18.5 cm    --------- LVOT peak gradient, S           4   mm Hg  ---------  Aortic valve               Value     Reference Aortic valve peak velocity, S       130  cm/s   --------- Aortic valve mean velocity, S       83.8 cm/s   --------- Aortic valve VTI, S            21.5 cm    --------- Aortic mean gradient, S          3   mm Hg  --------- Aortic peak gradient, S          7   mm Hg  --------- VTI ratio, LVOT/AV            0.86      --------- Aortic valve area/bsa, VTI        1.65 cm^2/m^2 --------- Velocity ratio, peak, LVOT/AV       0.79       --------- Aortic valve area/bsa, peak        1.52 cm^2/m^2 --------- velocity Velocity ratio, mean, LVOT/AV       0.82      --------- Aortic valve area/bsa, mean        1.57 cm^2/m^2 --------- velocity  Aorta                   Value     Reference Aortic root ID, ED            26  mm    ---------  Left atrium                Value     Reference LA ID, A-P, ES  36  mm    --------- LA ID/bsa, A-P              2   cm/m^2  <=2.2 LA volume, S               40  ml    --------- LA volume/bsa, S             22.2 ml/m^2  --------- LA volume, ES, 1-p A4C          49  ml    --------- LA volume/bsa, ES, 1-p A4C        27.2 ml/m^2  --------- LA volume, ES, 1-p A2C          32  ml    --------- LA volume/bsa, ES, 1-p A2C        17.8 ml/m^2  ---------  Mitral valve               Value     Reference Mitral E-wave peak velocity        99.7 cm/s   --------- Mitral deceleration time         222  ms    150 - 230 Mitral peak gradient, D          4   mm Hg  ---------  Systemic veins              Value     Reference Estimated CVP               3   mm Hg  ---------  Legend: (L) and (H) mark values outside specified reference range.  ------------------------------------------------------------------- Prepared and Electronically Authenticated by  Marca Ancona, M.D. 2016-07-28T17:35:30   Impression:  Overall Mr. Winski is doing very well following TAVR surgery. I encouraged him to continue walking and participate in cardiac rehab which he can do at home in [place], [place]. He will continue on the amiodarone at 400 mg bid until 02/20/2015 and then will reduce the  dose to 200 mg daily. I reviewed this with his wife.  I am going to see him back at the end of August and will stop the amiodarone at that time if he is maintaining sinus rhythm. I told him he can drive a car and return to his normal activity as he feels able.  Plan:  He will return to his home in [place], [place] probably today and return to see me at the end of August.   Alleen Borne, MD [facility] [phone]

## 2015-02-13 ENCOUNTER — Ambulatory Visit: Payer: Medicare Other | Admitting: Surgery

## 2015-02-20 ENCOUNTER — Ambulatory Visit: Payer: Medicare Other | Admitting: Surgery

## 2015-03-10 NOTE — Progress Notes (Signed)
Cardiology Office Note Date:  03/12/2015   ID:  Brandon Scott, DOB 1941-02-16, MRN [id number]  PCP:  Pcp Not In System  Cardiologist:  [name], MD    Chief Complaint  Patient presents with  . Aortic Stenosis    History of Present Illness: Brandon Scott is a 74 y.o. male who presents for 30-day TAVR follow-up. He underwent TAVR via a right transfemoral approach on 02/05/2015 for treatment of severe symptomatic aortic stenosis. His early post-operative course was notable for post-op atrial fibrillation but he maintained sinus rhythm on oral amiodarone.    the patient has done well since hospital discharge. He denies chest pain, chest pressure, or shortness of breath. He has been walking without exertional symptoms. He denies leg swelling, orthopnea, PND, or heart palpitations.   Past Medical History  Diagnosis Date  . CAD (coronary artery disease)   . Aortic stenosis   . Hyperlipidemia   . Hypertension   . Left ventricular hypertrophy   . Arthritis   . Stroke     mini stroke 24 years ago  . GERD (gastroesophageal reflux disease)   . 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 Cataracts, bilateral     Past Surgical History  Procedure Laterality Date  . Tee without cardioversion  12/07/14    [facility] [place]  . Cardiac catheterization  01/17/15    [facility] [place] [place]  . Nasal sinus surgery    . Coronary artery bypass graft  2005    4 grafts  . Fracture surgery Left     leg  . Colonoscopy    . Transcatheter aortic valve replacement, transfemoral N/A 02/05/2015    Procedure: TRANSCATHETER AORTIC VALVE REPLACEMENT, TRANSFEMORAL;  Surgeon: [name], MD;  Location: [facility] OR;  Service: Open Heart Surgery;  Laterality: N/A;  . Tee without cardioversion N/A 02/05/2015    Procedure: TRANSESOPHAGEAL ECHOCARDIOGRAM (TEE);  Surgeon: [name], MD;  Location: [facility] OR;  Service: Open Heart Surgery;  Laterality:  N/A;    Current Outpatient Prescriptions  Medication Sig Dispense Refill  . aspirin 81 MG tablet Take 81 mg by mouth daily.    . clopidogrel (PLAVIX) 75 MG tablet Take 1 tablet (75 mg total) by mouth daily with breakfast. 30 tablet 11  . fluticasone (FLONASE) 50 MCG/ACT nasal spray Place 1 spray into both nostrils 2 (two) times daily.    Marland Kitchen ibuprofen (ADVIL,MOTRIN) 200 MG tablet Take 400 mg by mouth every 8 (eight) hours as needed for mild pain or moderate pain.    . niacin 500 MG tablet Take 1,000 mg by mouth 2 (two) times daily with a meal.    . omeprazole (PRILOSEC) 40 MG capsule Take 1 capsule daily  0  . primidone (MYSOLINE) 50 MG tablet Take 50 mg by mouth 2 (two) times daily.      No current facility-administered medications for this visit.    Allergies:   Sulfa antibiotics   Social History:  The patient  reports that he quit smoking about 40 years ago. His smoking use included Cigarettes. He has a 15 pack-year smoking history. He has never used smokeless tobacco. He reports that he drinks alcohol. He reports that he does not use illicit drugs.   Family History:  The patient's  family history includes Heart attack in his father and mother. There is no history of Hypertension or Stroke.    ROS:  Please  see the history of present illness.  All other systems are reviewed and negative.    PHYSICAL EXAM: VS:  BP 118/68 mmHg  Pulse 45  Ht  (1.753 m)  Wt 145 lb (65.772 kg)  BMI 21.40 kg/m2 , BMI Body mass index is 21.4 kg/(m^2). GEN: Well nourished, well developed, elderly appearing male in no acute distress HEENT: normal Neck: no JVD, no masses.  Cardiac: bradycardic, regular, with soft SEM at the RUSB                Respiratory:  clear to auscultation bilaterally, normal work of breathing GI: soft, nontender, nondistended, + BS MS: no deformity or atrophy Ext: no pretibial edema, groin incision healing well. pedal pulses 2+= bilaterally Skin: warm and dry, no  rash Neuro:  Strength and sensation are intact Psych: euthymic mood, full affect  EKG:  EKG is ordered today. The ekg ordered today shows  Marked sinus bradycardia 45 bpm , age-indeterminate septal infarct.  Recent Labs: 02/01/2015: ALT 12* 02/06/2015: BUN 11; Creatinine, Ser 0.87; Hemoglobin 12.6*; Magnesium 1.9; Platelets 146*; Potassium 3.8; Sodium 133*   Lipid Panel  No results found for: CHOL, TRIG, HDL, CHOLHDL, VLDL, LDLCALC, LDLDIRECT    Wt Readings from Last 3 Encounters:  03/11/15 145 lb (65.772 kg)  02/12/15 142 lb (64.411 kg)  02/08/15 142 lb 11.2 oz (64.728 kg)     Cardiac Studies Reviewed: 2D Echocardiogram: Study Conclusions  - Left ventricle: The cavity size was normal. Wall thickness was normal. Systolic function was normal. The estimated ejection fraction was in the range of 55% to 60%. Wall motion was normal; there were no regional wall motion abnormalities. Features are consistent with a pseudonormal left ventricular filling pattern, with concomitant abnormal relaxation and increased filling pressure (grade 2 diastolic dysfunction). Doppler parameters are consistent with high ventricular filling pressure. - Aortic valve: A bioprosthesis was present. There was mild regurgitation. Mean gradient (S): 13 mm Hg. Peak gradient (S): 23 mm Hg. - Mitral valve: Calcified annulus. Mildly thickened leaflets . There was mild regurgitation. - Left atrium: The atrium was mildly dilated. - Right atrium: The atrium was mildly dilated.  Impressions:  - Normal LV function; grade 2 diastolic dysfunction; s/p TAVR with trace AI and mean gradient 13 mmHg; mild MR; mild LAE and RAE.  ASSESSMENT AND PLAN: 1.  Severe aortic stenosis now s/p TAVR:  Patient is doing well with New York Heart Association functional class I symptoms. I have personally reviewed his echocardiogram today and it demonstrates normal function of his transcatheter heart valve with  trace aortic insufficiency. He was reminded about the need for SBE prophylaxis. He will return in one year for his echocardiogram and valve clinic appointment. He was encouraged to enroll in cardiac rehabilitation and follow-up with his primary cardiologist locally.  2. Post-op atrial fibrillation:  Now with asymptomatic but marked sinus bradycardia. He completed oral amiodarone this morning. I advised him to discontinue metoprolol in the setting of bradycardia. He has a home blood pressure cuff and will monitor his heart rate periodically. As above he will follow-up with his primary cardiologist.   Current medicines are reviewed with the patient today.  The patient does not have concerns regarding medicines.  Labs/ tests ordered today include:   Orders Placed This Encounter  Procedures  . EKG 12-Lead    Disposition:   FU one year Valve Clinic visit.  Enzo Bi, MD  03/12/2015 9:18 AM    [facility]  [address], [place], [place]  [address] Phone: [phone]; Fax: [phone]

## 2015-03-11 ENCOUNTER — Ambulatory Visit (INDEPENDENT_AMBULATORY_CARE_PROVIDER_SITE_OTHER): Payer: Medicare Other | Admitting: Cardiovascular Disease

## 2015-03-11 ENCOUNTER — Other Ambulatory Visit: Payer: Self-pay | Admitting: *Deleted

## 2015-03-11 ENCOUNTER — Encounter: Payer: Self-pay | Admitting: Cardiovascular Disease

## 2015-03-11 ENCOUNTER — Other Ambulatory Visit: Payer: Self-pay

## 2015-03-11 ENCOUNTER — Ambulatory Visit (HOSPITAL_COMMUNITY): Payer: Medicare Other | Attending: Cardiology

## 2015-03-11 ENCOUNTER — Ambulatory Visit: Payer: Medicare Other | Admitting: Cardiovascular Disease

## 2015-03-11 VITALS — BP 118/68 | HR 45 | Ht 69.0 in | Wt 145.0 lb

## 2015-03-11 DIAGNOSIS — E785 Hyperlipidemia, unspecified: Secondary | ICD-10-CM

## 2015-03-11 DIAGNOSIS — Z87891 Personal history of nicotine dependence: Secondary | ICD-10-CM | POA: Diagnosis not present

## 2015-03-11 DIAGNOSIS — I350 Nonrheumatic aortic (valve) stenosis: Secondary | ICD-10-CM | POA: Diagnosis not present

## 2015-03-11 DIAGNOSIS — I517 Cardiomegaly: Secondary | ICD-10-CM | POA: Diagnosis not present

## 2015-03-11 DIAGNOSIS — I340 Nonrheumatic mitral (valve) insufficiency: Secondary | ICD-10-CM | POA: Insufficient documentation

## 2015-03-11 DIAGNOSIS — Z954 Presence of other heart-valve replacement: Secondary | ICD-10-CM | POA: Insufficient documentation

## 2015-03-11 DIAGNOSIS — I351 Nonrheumatic aortic (valve) insufficiency: Secondary | ICD-10-CM | POA: Diagnosis not present

## 2015-03-11 DIAGNOSIS — I10 Essential (primary) hypertension: Secondary | ICD-10-CM | POA: Diagnosis not present

## 2015-03-11 DIAGNOSIS — Z952 Presence of prosthetic heart valve: Secondary | ICD-10-CM

## 2015-03-11 DIAGNOSIS — R001 Bradycardia, unspecified: Secondary | ICD-10-CM

## 2015-03-11 NOTE — Patient Instructions (Signed)
Medication Instructions:  Your physician has recommended you make the following change in your medication:  1. STOP Metoprolol Tartrate  Labwork: No new orders.   Testing/Procedures: Your physician has requested that you have an echocardiogram in 1 YEAR. Echocardiography is a painless test that uses sound waves to create images of your heart. It provides your doctor with information about the size and shape of your heart and how well your heart's chambers and valves are working. This procedure takes approximately one hour. There are no restrictions for this procedure.  Follow-Up: Your physician wants you to follow-up in: 1 YEAR with Dr Excell Seltzer.  You will receive a reminder letter in the mail two months in advance. If you don't receive a letter, please call our office to schedule the follow-up appointment.   Any Other Special Instructions Will Be Listed Below (If Applicable).

## 2015-03-12 ENCOUNTER — Encounter: Payer: Self-pay | Admitting: Cardiovascular Disease

## 2015-12-28 IMAGING — DX DG CHEST 1V PORT
1 series · 1 of 1 positions shown · non-contrast
Comparison: February 05, 2015.

CLINICAL DATA: Status post transcatheter aortic valve replacement.

EXAM:
PORTABLE CHEST - 1 VIEW

[chest ap]
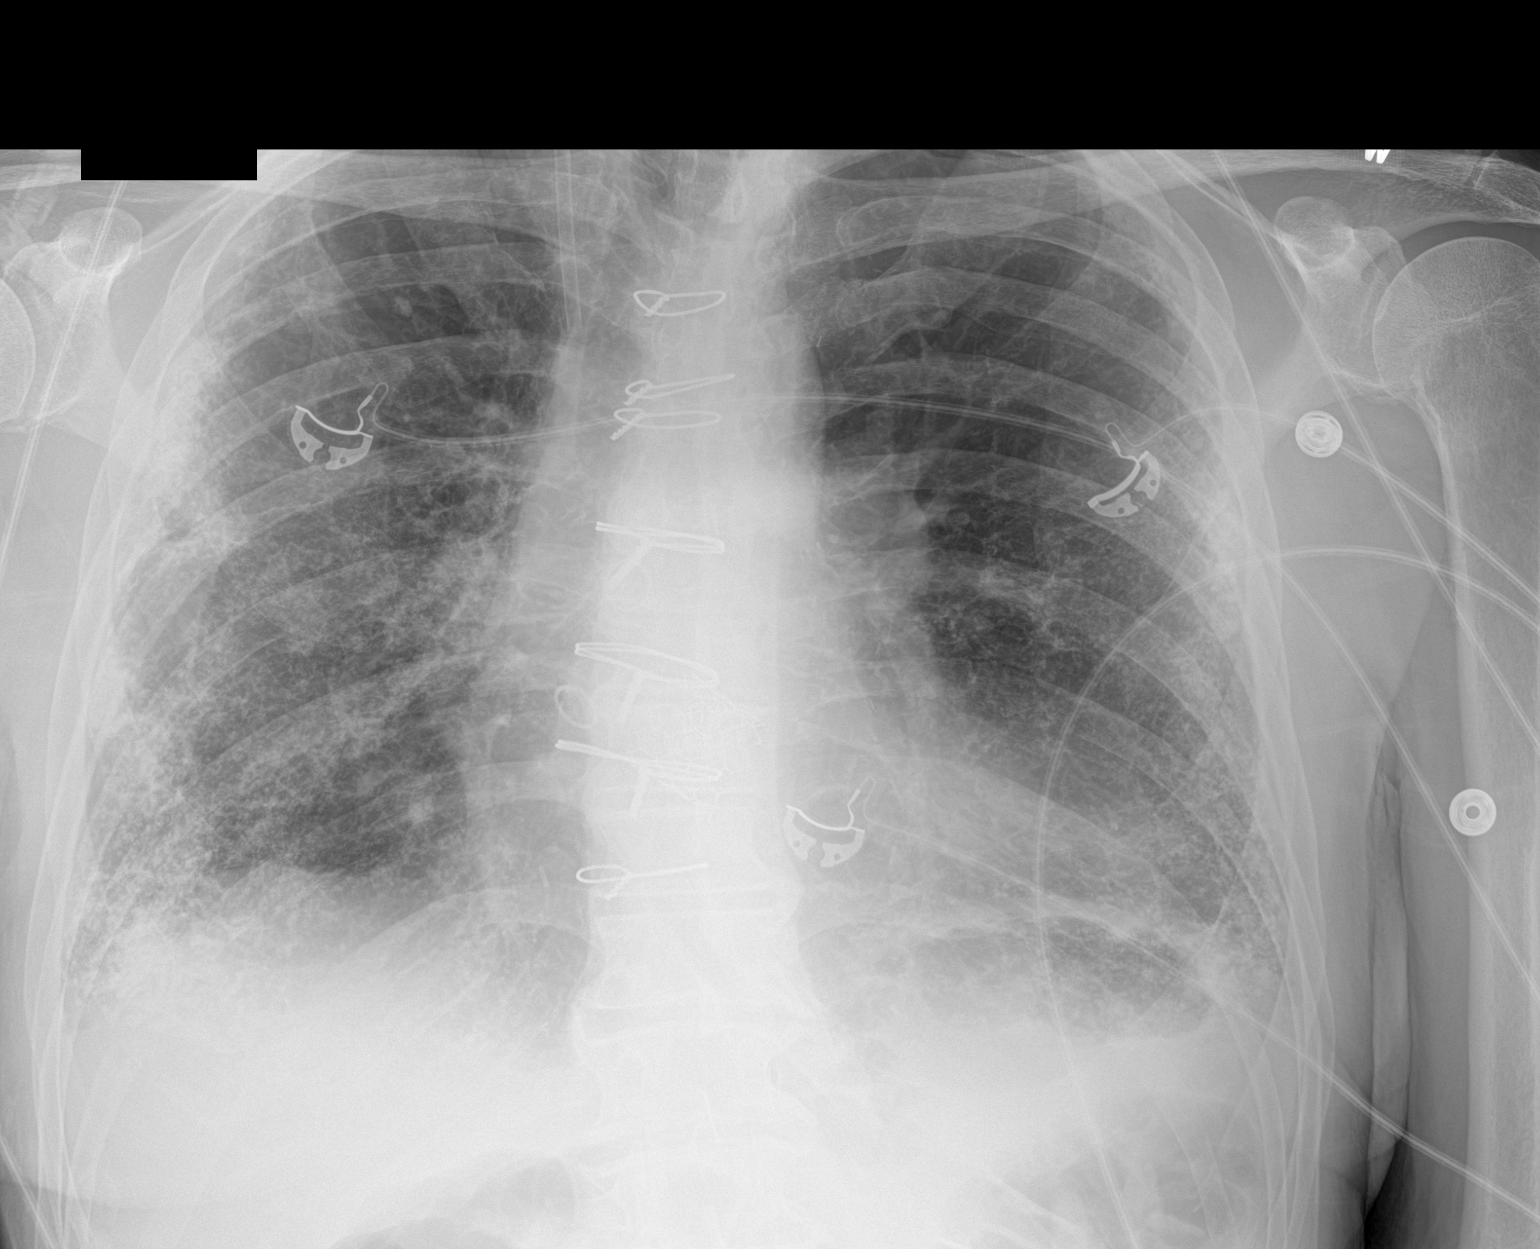

[1 of 1 positions shown; findings below may reference images not displayed]

FINDINGS: Status post cardiac valve repair. Patient also appears to be status
post coronary artery bypass graft. Right internal jugular venous
sheath remains following removal of Swan-Ganz catheter. No
pneumothorax is noted. Probable minimal bilateral pleural effusions
are noted. Stable diffuse small calcifications are seen throughout
both lungs consistent with fibrosis or pneumoconiosis. No
significant changes noted compared to prior exam.
IMPRESSION: No significant involving diffuse small calcifications throughout
both lungs consistent with fibrosis or pneumoconiosis. No
significant change in pulmonary findings is noted compared to prior
exam. Minimal bilateral pleural effusions may be present. No
pneumothorax is noted.

## 2016-02-04 ENCOUNTER — Other Ambulatory Visit: Payer: Self-pay | Admitting: *Deleted

## 2016-02-04 MED ORDER — CLOPIDOGREL BISULFATE 75 MG PO TABS: 75.0000 mg | ORAL_TABLET | Freq: Every day | ORAL | 0 refills | Status: AC

## 2016-02-12 ENCOUNTER — Other Ambulatory Visit (HOSPITAL_COMMUNITY): Payer: Medicare Other

## 2016-02-12 ENCOUNTER — Ambulatory Visit: Payer: Medicare Other | Admitting: Cardiovascular Disease

## 2016-04-02 ENCOUNTER — Ambulatory Visit: Payer: Medicare Other | Admitting: Cardiovascular Disease

## 2016-04-02 ENCOUNTER — Other Ambulatory Visit (HOSPITAL_COMMUNITY): Payer: Medicare Other

## 2017-11-10 DEATH — deceased
